# Patient Record
Sex: Female | Born: 1980 | Race: White | Hispanic: No | Marital: Single | Smoking: Current every day smoker
Health system: Southern US, Community
[De-identification: ages and names within clinical notes are randomized; demographics above are authoritative.]

## PROBLEM LIST (undated history)

## (undated) ENCOUNTER — Inpatient Hospital Stay (HOSPITAL_COMMUNITY): Payer: Self-pay

## (undated) DIAGNOSIS — F419 Anxiety disorder, unspecified: Secondary | ICD-10-CM

## (undated) DIAGNOSIS — F341 Dysthymic disorder: Secondary | ICD-10-CM

## (undated) DIAGNOSIS — Z872 Personal history of diseases of the skin and subcutaneous tissue: Secondary | ICD-10-CM

## (undated) DIAGNOSIS — G43909 Migraine, unspecified, not intractable, without status migrainosus: Secondary | ICD-10-CM

## (undated) DIAGNOSIS — O24419 Gestational diabetes mellitus in pregnancy, unspecified control: Secondary | ICD-10-CM

## (undated) HISTORY — PX: OVARIAN CYST SURGERY: SHX726

---

## 2002-05-25 ENCOUNTER — Encounter (INDEPENDENT_AMBULATORY_CARE_PROVIDER_SITE_OTHER): Payer: Self-pay

## 2002-05-25 ENCOUNTER — Observation Stay (HOSPITAL_COMMUNITY): Admission: AD | Admit: 2002-05-25 | Discharge: 2002-05-26 | Payer: Self-pay | Admitting: Gynecology

## 2002-05-25 ENCOUNTER — Encounter: Payer: Self-pay | Admitting: Gynecology

## 2002-11-18 ENCOUNTER — Other Ambulatory Visit: Admission: RE | Admit: 2002-11-18 | Discharge: 2002-11-18 | Payer: Self-pay | Admitting: Gynecology

## 2003-12-05 ENCOUNTER — Ambulatory Visit: Payer: Self-pay | Admitting: Internal Medicine

## 2003-12-20 ENCOUNTER — Ambulatory Visit: Payer: Self-pay | Admitting: Internal Medicine

## 2004-01-15 ENCOUNTER — Other Ambulatory Visit: Admission: RE | Admit: 2004-01-15 | Discharge: 2004-01-15 | Payer: Self-pay | Admitting: Gynecology

## 2004-01-31 ENCOUNTER — Ambulatory Visit: Payer: Self-pay | Admitting: Internal Medicine

## 2004-03-12 ENCOUNTER — Ambulatory Visit: Payer: Self-pay | Admitting: Internal Medicine

## 2004-04-01 ENCOUNTER — Ambulatory Visit: Payer: Self-pay | Admitting: Internal Medicine

## 2004-05-01 ENCOUNTER — Ambulatory Visit: Payer: Self-pay | Admitting: Internal Medicine

## 2004-05-10 ENCOUNTER — Ambulatory Visit: Payer: Self-pay | Admitting: Internal Medicine

## 2004-11-01 ENCOUNTER — Ambulatory Visit: Payer: Self-pay | Admitting: Internal Medicine

## 2005-01-24 ENCOUNTER — Other Ambulatory Visit: Admission: RE | Admit: 2005-01-24 | Discharge: 2005-01-24 | Payer: Self-pay | Admitting: Gynecology

## 2005-04-28 ENCOUNTER — Ambulatory Visit: Payer: Self-pay | Admitting: Internal Medicine

## 2005-07-01 ENCOUNTER — Ambulatory Visit: Payer: Self-pay | Admitting: Internal Medicine

## 2005-09-03 ENCOUNTER — Other Ambulatory Visit: Admission: RE | Admit: 2005-09-03 | Discharge: 2005-09-03 | Payer: Self-pay | Admitting: Gynecology

## 2006-01-13 HISTORY — PX: DILATION AND CURETTAGE OF UTERUS: SHX78

## 2006-02-19 ENCOUNTER — Inpatient Hospital Stay (HOSPITAL_COMMUNITY): Admission: AD | Admit: 2006-02-19 | Discharge: 2006-02-19 | Payer: Self-pay | Admitting: Obstetrics and Gynecology

## 2006-04-09 ENCOUNTER — Encounter: Admission: RE | Admit: 2006-04-09 | Discharge: 2006-04-09 | Payer: Self-pay | Admitting: Obstetrics and Gynecology

## 2006-05-18 ENCOUNTER — Inpatient Hospital Stay (HOSPITAL_COMMUNITY): Admission: AD | Admit: 2006-05-18 | Discharge: 2006-05-22 | Payer: Self-pay | Admitting: Obstetrics and Gynecology

## 2006-05-23 ENCOUNTER — Encounter: Admission: RE | Admit: 2006-05-23 | Discharge: 2006-06-22 | Payer: Self-pay | Admitting: Obstetrics and Gynecology

## 2006-06-23 ENCOUNTER — Encounter: Admission: RE | Admit: 2006-06-23 | Discharge: 2006-07-22 | Payer: Self-pay | Admitting: Obstetrics and Gynecology

## 2006-07-23 ENCOUNTER — Encounter: Admission: RE | Admit: 2006-07-23 | Discharge: 2006-08-13 | Payer: Self-pay | Admitting: Obstetrics and Gynecology

## 2006-09-07 ENCOUNTER — Ambulatory Visit: Payer: Self-pay | Admitting: Internal Medicine

## 2006-11-23 ENCOUNTER — Encounter: Payer: Self-pay | Admitting: *Deleted

## 2006-11-23 DIAGNOSIS — Z9889 Other specified postprocedural states: Secondary | ICD-10-CM | POA: Insufficient documentation

## 2006-11-23 DIAGNOSIS — N83209 Unspecified ovarian cyst, unspecified side: Secondary | ICD-10-CM

## 2006-11-23 DIAGNOSIS — F341 Dysthymic disorder: Secondary | ICD-10-CM

## 2006-11-23 DIAGNOSIS — Z872 Personal history of diseases of the skin and subcutaneous tissue: Secondary | ICD-10-CM | POA: Insufficient documentation

## 2006-11-23 DIAGNOSIS — G43909 Migraine, unspecified, not intractable, without status migrainosus: Secondary | ICD-10-CM

## 2006-11-23 HISTORY — DX: Dysthymic disorder: F34.1

## 2006-11-23 HISTORY — DX: Personal history of diseases of the skin and subcutaneous tissue: Z87.2

## 2006-11-23 HISTORY — DX: Migraine, unspecified, not intractable, without status migrainosus: G43.909

## 2007-08-09 ENCOUNTER — Other Ambulatory Visit: Admission: RE | Admit: 2007-08-09 | Discharge: 2007-08-09 | Payer: Self-pay | Admitting: Gynecology

## 2007-11-05 ENCOUNTER — Telehealth: Payer: Self-pay | Admitting: Internal Medicine

## 2007-11-09 ENCOUNTER — Ambulatory Visit: Payer: Self-pay | Admitting: Internal Medicine

## 2007-11-09 DIAGNOSIS — K644 Residual hemorrhoidal skin tags: Secondary | ICD-10-CM | POA: Insufficient documentation

## 2007-11-09 DIAGNOSIS — K625 Hemorrhage of anus and rectum: Secondary | ICD-10-CM

## 2008-02-16 ENCOUNTER — Ambulatory Visit: Payer: Self-pay | Admitting: Gynecology

## 2008-04-25 ENCOUNTER — Ambulatory Visit: Payer: Self-pay | Admitting: Gynecology

## 2008-05-16 ENCOUNTER — Ambulatory Visit: Payer: Self-pay | Admitting: Gynecology

## 2008-08-09 ENCOUNTER — Ambulatory Visit: Payer: Self-pay | Admitting: Gynecology

## 2008-08-29 ENCOUNTER — Ambulatory Visit: Payer: Self-pay | Admitting: Gynecology

## 2008-08-29 ENCOUNTER — Other Ambulatory Visit: Admission: RE | Admit: 2008-08-29 | Discharge: 2008-08-29 | Payer: Self-pay | Admitting: Gynecology

## 2008-08-29 ENCOUNTER — Encounter: Payer: Self-pay | Admitting: Gynecology

## 2008-10-10 ENCOUNTER — Ambulatory Visit: Payer: Self-pay | Admitting: Gynecology

## 2008-11-14 ENCOUNTER — Telehealth: Payer: Self-pay | Admitting: Internal Medicine

## 2009-01-19 ENCOUNTER — Ambulatory Visit: Payer: Self-pay | Admitting: Gynecology

## 2009-01-26 ENCOUNTER — Ambulatory Visit: Payer: Self-pay | Admitting: Gynecology

## 2009-02-12 ENCOUNTER — Ambulatory Visit: Payer: Self-pay | Admitting: Gynecology

## 2009-04-26 ENCOUNTER — Ambulatory Visit: Payer: Self-pay | Admitting: Gynecology

## 2009-05-21 ENCOUNTER — Ambulatory Visit: Payer: Self-pay | Admitting: Gynecology

## 2009-05-25 ENCOUNTER — Telehealth: Payer: Self-pay | Admitting: Internal Medicine

## 2010-02-12 NOTE — Progress Notes (Signed)
Summary: pt seen today   Phone Note From Other Clinic Call back at [phone]   Caller: Sherrilyn Rist from Dr Reynold Bowen office Call For: Leone Payor Reason for Call: Schedule Patient Appt Summary of Call: Dr Audie Box would like this patient seen today for hemorroid thrombose Initial call taken by: Tawni Levy,  May 25, 2009 9:38 AM  Follow-up for Phone Call        Patient  has been treated for 1 week and now having worsening pain.  Dr Audie Box says hemorrhoid is thrombosed.  I have advied them we would send her to [facility] and not be able to treat here.  They will refer the patient to [facility]. Follow-up by: Darcey Nora RN, CGRN,  May 25, 2009 9:52 AM

## 2010-05-28 NOTE — Op Note (Signed)
NAMEPEGGIE, HORNAK             ACCOUNT NO.:  [id number]   MEDICAL RECORD NO.:  [id number]          PATIENT TYPE:  INP   LOCATION:  9126                          FACILITY:  [facility]   PHYSICIAN:  Duke Salvia. Marcelle Overlie, M.D.DATE OF BIRTH:  [birth year]   DATE OF PROCEDURE:  DATE OF DISCHARGE:                               OPERATIVE REPORT   This patient had an approximately two hour second stage.  I was not  notified of any FHR abnormalities during the pushing phase; was called  for delivery.  She was crowning and promptly delivered as soon as she  was prepped.  Tight nuchal cord was noted; was not reducible, was  clamped and cut with easy delivery of the shoulders.  Apgars 4 and 7.  Initially took some breaths but then failed to ventilate well.  Some  minimal Ambu ventilation with O2 and stimulation with excellent fetal  heart rate from the beginning.  Apgars 4 and 7, ph 7.09.  Peds team did  arrive to monitor the baby early on.  Placenta was spontaneous and  intact. Cord blood was sent.  EBL 350.  Only a small primary first  degree vaginal laceration repaired with 3-0 Vicryl ped sutures.  Mother  and baby doing well at that point.      Richard M. Marcelle Overlie, M.D.  Electronically Signed     RMH/MEDQ  D:  05/19/2006  T:  05/20/2006  Job:  [id number]

## 2010-05-31 NOTE — H&P (Signed)
NAME:  Kim Carrillo, Kim Carrillo                         ACCOUNT NO.:  [id number]   MEDICAL RECORD NO.:  [id number]                   PATIENT TYPE:  MAT   LOCATION:  [facility]                                 FACILITY:  [facility]   PHYSICIAN:  Timothy P. Fontaine, M.D.           DATE OF BIRTH:  29-May-1980   DATE OF ADMISSION:  05/25/2002  DATE OF DISCHARGE:                                HISTORY & PHYSICAL   CHIEF COMPLAINT:  Abdominal pain.   HISTORY OF PRESENT ILLNESS:  A 30 year old G2, P0, TAB1 female who presents  with a one-day history of increasing abdominal pain.  She was evaluated at  [facility], was found to have a positive UPT and acute  abdominal changes, and was sent to [facility] for further evaluation.  Patient notes a therapeutic abortion approximately two and one-half months  ago.  She had what was thought to be a normal period 04/28/2002, has not  bled since, and now presents with increasing abdominal pain.  Evaluation  here reveals a beta HCG in the 40 range, stable hemoglobin at 11, having  been 11 at the urgent care on their evaluation, and an ultrasound showing no  intrauterine gestational sac, a large amount of complex fluid in the pelvis,  consistent with blood, some in Morison's pouch, complex cystic mass, right  ovary, question ovarian cyst rupture versus ruptured ectopic.  Patient is  admitted at this time for diagnostic laparoscopy and removal of an ectopic  pregnancy.   PAST MEDICAL HISTORY:  None.   PAST SURGICAL HISTORY:  TAB.   ALLERGIES:  None.   GYNECOLOGIC HISTORY:  Otherwise unremarkable.   REVIEW OF SYSTEMS:  Noncontributory.   SOCIAL HISTORY:  Noncontributory.  Condom birth control.   ADMISSION EXAMINATION:  VITAL SIGNS:  Stable.  HEENT:  Normal.  LUNGS:  Clear.  CARDIAC:  Regular rate without rubs, murmurs, or gallops.  ABDOMINAL EXAM:  Abdomen is tense, diffusely tender with rebound and  guarding.  No gross mass is palpated.  PELVIC:  Bimanual with 4+ cervical motion tenderness, precluding adequate  bimanual.   ASSESSMENT AND PLAN:  A 30 year old G2, P0, TAB1 female, low-positive beta  human chorionic gonadotropin, acute abdominal changes, ultrasound consistent  with blood in the pelvis, history of therapeutic abortion two and one-half  months ago, reportedly normal menses 04/28/2002, hemoglobin 11, normal white  count.  Discussed with patient and her boyfriend the situation and  differential.  The low beta does not fit a clear picture for the ectopic,  although certainly with the large amount of blood and the fluid, that is the  leading diagnosis.  Other possibilities to include a normal intrauterine  pregnancy with ruptured ovarian cyst as well as nongynecologic pathology  were also discussed with her.  She understands that we will initiate a  diagnostic laparoscopy and then be directed as to what we find at that time.  If ectopic pregnancy,  I discussed a linear salpingostomy attempts versus  salpingectomy, either partial or complete.  Given the large amount of blood,  I suspect that there is a rupture and that partial, if not complete,  salpingectomy will be warranted.  The patient understands this and gives me  permission for removal of her tube if I feel necessary.  If cornual  pregnancy, patient understands that we must proceed with an exploratory  laparotomy for excision as well as that we may need to convert this to an  exploratory laparotomy at any time if felt necessary.  If pelvis is clear,  then the issue is whether to proceed with a dilation and curettage was  discussed with her and her boyfriend, and they both agree that a dilation  and curettage would be indicated and that they understand and accept that it  might interrupt a normal early pregnancy.  The risks of surgery were  discussed, the expected intraoperative, postoperative courses, use of the  laparoscope, multiple abdominal ports, as well  as the possible exploratory  laparotomy.  The risks of bleeding, necessitating transfusion, and the risks  of transfusion were discussed with her.  The risks of infection, both  internal, requiring prolonged antibiotics, as well as incisional, requiring  opening and draining of incisions or drainage of abscess formations were all  discussed with her.  The risks of inadvertant injury to internal organs,  including bowel, bladder, ureters, vessels, and nerves, necessitating major  exploratory reparative surgeries and future reparative surgeries, including  ostomy formation, were all discussed, understood, and accepted.  Patient's  questions were answered to her satisfaction, and she was ready to proceed  with surgery.                                               Timothy P. Audie Box, M.D.    TPF/MEDQ  D:  05/25/2002  T:  05/25/2002  Job:  [id number]

## 2010-05-31 NOTE — Op Note (Signed)
NAME:  Kim Carrillo, Kim Carrillo                         ACCOUNT NO.:  [id number]   MEDICAL RECORD NO.:  [id number]                   PATIENT TYPE:  MAT   LOCATION:  [facility]                                 FACILITY:  [facility]   PHYSICIAN:  Timothy P. Fontaine, M.D.           DATE OF BIRTH:  [birth year]   DATE OF PROCEDURE:  05/25/2002  DATE OF DISCHARGE:                                 OPERATIVE REPORT   PREOPERATIVE DIAGNOSIS:  Rule out ectopic pregnancy.   POSTOPERATIVE DIAGNOSIS:  Ruptured right ovarian cyst.   PROCEDURE:  Diagnostic laparoscopy.  Biopsy and fulguration of right ovarian  cyst.  Evacuation of hemoperitoneum.  Suction D & C.   SURGEON:  Timothy P. Fontaine, M.D.   ANESTHETIC:  General endotracheal.   COMPLICATIONS:  None.   ESTIMATED BLOOD LOSS:  Approximately 500 mL hemoperitoneum.  Minimal  surgical blood loss.   SPECIMENS:  Blood clot with ovarian capsule biopsy, D & C.   FINDINGS:  Approximately 500 mL hemoperitoneum.  Large clot adherent to the  right ovary.  Evacuation of the clot subsequently showed a pigmented, raised  area and a disrupted capsule with active bleeding, consistent with a  ruptured cyst.  Pelvis otherwise noted to be normal with anterior cul-de-sac  normal, posterior cul-de-sac normal, uterus normal size, shape, and contour,  right and left fallopian tubes normal length, caliber, and fimbriated ends.  Left ovary grossly normal, free and mobile.  Right ovary as described above.  Upper abdominal exam with blood coating of the intestines and hepatic space,  grossly normal to visual inspection.   PROCEDURE:  The patient was taken to the operating room and underwent  general endotracheal anesthesia.  She was placed in the low dorsal lithotomy  position, received an abdominal perineal vaginal preparation with Betadine  solution, __________ indwelling Foley catheterization, U/A performed, and a  Hulka tenaculum placed on the cervix.  The patient was then  draped in the  usual fashion.  A vertical infraumbilical incision was made.  The Veress  needle placed.  Position verified by water, and approximately 2 liters of  carbon dioxide gas was achieved without difficulty.  The 10 mm laparoscopic  trocar was then placed within the abdominal cavity without difficulty, its  position verified visually.  The left center suprapubic 5 mm port was then  placed under direct visualization after transillumination of the vessels  without difficulty.  Examination of the pelvic organs was then carried out  with the findings noted above.  A right-of-center 5 mm laparoscopic port was  also placed under direct visualization after transillumination for the  vessels.  The blood was then evacuated from the pelvis, the ovary irrigated,  the clot removed and sent as specimen.  A raised, pigmented area was noted.  This was biopsied off and again sent as specimen.  The bleeding edge of the  cyst was then cauterized with bipolar cautery, stopping the bleeding.  The  pelvis was then irrigated copiously, all blood and irrigant removed after  hemostasis was visualized, and the suprapubic ports were then removed under  direct visualization, and the infraumbilical port after the gas was allowed  to escape was backed out under direct visualization, noting adequate  hemostasis and no evidence of hernia formation. A 0 Vicryl interrupted  subcutaneous fascial stitch was placed infraumbilically, and all skin  incisions were closed using Dermabond skin adhesive.  0.25% Marcaine was  infiltrated in all skin incisions.  The frozen section was then called back  to the operating room with findings of clot but not evidence of POC.  As per  my prior discussion with the patient under this scenario, we proceeded with  a suction D & C, and the cervix was visualized, the Hulka tenaculum removed,  a single-tooth tenaculum placed, and the cervix was gently and gradually  dilated with the #7  suction curette.  The suction curettage was performed  with scant return.  A gentle sharp curettage was performed, all of which was  sent to pathology.  The instruments were removed.  Adequate hemostasis  visualized.  The patient was then placed in the supine position, awakened  without difficulty, and taken to the recovery room in good condition, having  tolerated the procedure well.                                               Timothy P. Audie Box, M.D.    TPF/MEDQ  D:  05/25/2002  T:  05/26/2002  Job:  [id number]

## 2010-05-31 NOTE — Discharge Summary (Signed)
NAME:  Kim Carrillo, Kim Carrillo                         ACCOUNT NO.:  [id number]   MEDICAL RECORD NO.:  [id number]                   PATIENT TYPE:  INP   LOCATION:  9118                                 [facility]ACILITY:  [facility]   PHYSICIAN:  Timothy P. Fontaine, M.D.           DATE OF BIRTH:  1980/09/03   DATE OF ADMISSION:  05/25/2002  DATE OF DISCHARGE:  05/26/2002                                 DISCHARGE SUMMARY   DISCHARGE DIAGNOSES:  1. Pregnancy.  2. Acute abdomen.  3. Hemoperitoneum.  4. Ruptured right ovarian cyst.   PROCEDURE:  1. Diagnostic laparoscopy.  2. Right ovarian biopsy and fulguration.  3. Evacuation of hemoperitoneum.  4. Dilatation and curettage.   PATHOLOGY:  Pending.   HOSPITAL COURSE:  The patient presented as per history and physical and was  taken to the operating room for diagnostic laparoscopy.  Laparoscopic  findings showed a bleeding right ovarian ruptured cyst with hemoperitoneum.  No gross evidence of ectopic pregnancy.  Frozen section from pathology from  evacuated clot ovarian capsule showed clot, no evidence of ectopic  pregnancy.  Per preoperative discussion a D&C was then performed.  Pathology  is pending.  The patient has done well since surgery.  Her preoperative  hematocrit is 33.  Postoperative hematocrit is 26.  She is doing scant  vaginal bleeding.  Discharge examination shows her abdomen to be soft,  minimal tenderness.  Incisions intact.  The patient was discharged with  Lortab 5.0 number 15 for pain with instructions to follow up in the office  in one week.  I discussed with the patient and her boyfriend the issues and  the absolute need for follow-up.  As per preoperative counseling, I reviewed  with them that if no overt ectopic pregnancy was found she could have an  abnormal intrauterine pregnancy or a remnant from her abortion two months  ago or a normal intrauterine pregnancy.  They have decided to proceed with  D&C regardless of the  possibilities and that even if it was a normal  pregnancy they would not want to continue it given the circumstances.  I did  discuss with them at discharge that very early pregnancies may be missed on  D&C and that there still could be an implantation with progression of a  pregnancy and the absolute need for follow-up postoperatively was stressed  and she will be seen in the office in one week.  Will plan on tentatively  following up on quantitative hCGs to assure clearance pending pathology  results for identifiable pregnancy tissue.  The patient's blood type is O+.  The patient's questions were answered and she will follow up in the office  in one week.  Timothy P. Audie Box, M.D.    TPF/MEDQ  D:  05/26/2002  T:  05/26/2002  Job:  [id number]

## 2010-09-09 ENCOUNTER — Other Ambulatory Visit: Payer: Self-pay | Admitting: Gynecology

## 2010-10-14 ENCOUNTER — Emergency Department (HOSPITAL_COMMUNITY): Payer: BC Managed Care – PPO

## 2010-10-14 ENCOUNTER — Emergency Department (HOSPITAL_COMMUNITY)
Admission: EM | Admit: 2010-10-14 | Discharge: 2010-10-14 | Disposition: A | Discharge status: Home / Self Care | Payer: BC Managed Care – PPO | Attending: Emergency Medicine | Admitting: Emergency Medicine

## 2010-10-14 DIAGNOSIS — R630 Anorexia: Secondary | ICD-10-CM | POA: Insufficient documentation

## 2010-10-14 DIAGNOSIS — R002 Palpitations: Secondary | ICD-10-CM | POA: Insufficient documentation

## 2010-10-14 DIAGNOSIS — R079 Chest pain, unspecified: Secondary | ICD-10-CM | POA: Insufficient documentation

## 2010-10-14 DIAGNOSIS — F411 Generalized anxiety disorder: Secondary | ICD-10-CM | POA: Insufficient documentation

## 2011-09-10 ENCOUNTER — Other Ambulatory Visit: Payer: Self-pay | Admitting: Gynecology

## 2011-10-13 ENCOUNTER — Emergency Department (HOSPITAL_BASED_OUTPATIENT_CLINIC_OR_DEPARTMENT_OTHER): Payer: BC Managed Care – PPO

## 2011-10-13 ENCOUNTER — Emergency Department (HOSPITAL_BASED_OUTPATIENT_CLINIC_OR_DEPARTMENT_OTHER)
Admission: EM | Admit: 2011-10-13 | Discharge: 2011-10-13 | Disposition: A | Discharge status: Home / Self Care | Payer: BC Managed Care – PPO | Attending: Emergency Medicine | Admitting: Emergency Medicine

## 2011-10-13 ENCOUNTER — Encounter (HOSPITAL_BASED_OUTPATIENT_CLINIC_OR_DEPARTMENT_OTHER): Payer: Self-pay | Admitting: *Deleted

## 2011-10-13 DIAGNOSIS — F411 Generalized anxiety disorder: Secondary | ICD-10-CM | POA: Insufficient documentation

## 2011-10-13 DIAGNOSIS — T7492XA Unspecified child maltreatment, confirmed, initial encounter: Secondary | ICD-10-CM | POA: Insufficient documentation

## 2011-10-13 DIAGNOSIS — IMO0002 Reserved for concepts with insufficient information to code with codable children: Secondary | ICD-10-CM | POA: Insufficient documentation

## 2011-10-13 DIAGNOSIS — S0083XA Contusion of other part of head, initial encounter: Secondary | ICD-10-CM | POA: Insufficient documentation

## 2011-10-13 DIAGNOSIS — S0003XA Contusion of scalp, initial encounter: Secondary | ICD-10-CM | POA: Insufficient documentation

## 2011-10-13 DIAGNOSIS — T7491XA Unspecified adult maltreatment, confirmed, initial encounter: Secondary | ICD-10-CM | POA: Insufficient documentation

## 2011-10-13 DIAGNOSIS — S0093XA Contusion of unspecified part of head, initial encounter: Secondary | ICD-10-CM

## 2011-10-13 HISTORY — DX: Anxiety disorder, unspecified: F41.9

## 2011-10-13 HISTORY — DX: Migraine, unspecified, not intractable, without status migrainosus: G43.909

## 2011-10-13 MED ORDER — HYDROCODONE-ACETAMINOPHEN 5-325 MG PO TABS: 2.0000 | ORAL_TABLET | ORAL | Status: DC | PRN

## 2011-10-13 MED ORDER — TRAMADOL HCL 50 MG PO TABS: 50.0000 mg | ORAL_TABLET | Freq: Four times a day (QID) | ORAL | Status: DC | PRN
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
## 2011-10-13 NOTE — ED Provider Notes (Signed)
Medical screening examination/treatment/procedure(s) were performed by non-physician practitioner and as supervising physician I was immediately available for consultation/collaboration.   Abigail Teall, MD 10/13/11 1729 

## 2012-05-07 ENCOUNTER — Emergency Department (HOSPITAL_COMMUNITY): Payer: Medicaid Other

## 2012-05-07 ENCOUNTER — Encounter (HOSPITAL_COMMUNITY): Payer: Self-pay | Admitting: *Deleted

## 2012-05-07 ENCOUNTER — Emergency Department (HOSPITAL_COMMUNITY)
Admission: EM | Admit: 2012-05-07 | Discharge: 2012-05-07 | Disposition: A | Discharge status: Home / Self Care | Payer: Medicaid Other | Attending: Emergency Medicine | Admitting: Emergency Medicine

## 2012-05-07 DIAGNOSIS — K8020 Calculus of gallbladder without cholecystitis without obstruction: Secondary | ICD-10-CM | POA: Diagnosis present

## 2012-05-07 DIAGNOSIS — R059 Cough, unspecified: Secondary | ICD-10-CM | POA: Diagnosis present

## 2012-05-07 DIAGNOSIS — R197 Diarrhea, unspecified: Secondary | ICD-10-CM | POA: Insufficient documentation

## 2012-05-07 DIAGNOSIS — B9789 Other viral agents as the cause of diseases classified elsewhere: Secondary | ICD-10-CM | POA: Insufficient documentation

## 2012-05-07 DIAGNOSIS — R05 Cough: Secondary | ICD-10-CM | POA: Diagnosis present

## 2012-05-07 DIAGNOSIS — F341 Dysthymic disorder: Secondary | ICD-10-CM | POA: Insufficient documentation

## 2012-05-07 DIAGNOSIS — K529 Noninfective gastroenteritis and colitis, unspecified: Secondary | ICD-10-CM | POA: Diagnosis present

## 2012-05-07 DIAGNOSIS — Z872 Personal history of diseases of the skin and subcutaneous tissue: Secondary | ICD-10-CM | POA: Insufficient documentation

## 2012-05-07 DIAGNOSIS — B349 Viral infection, unspecified: Secondary | ICD-10-CM

## 2012-05-07 DIAGNOSIS — R1110 Vomiting, unspecified: Secondary | ICD-10-CM | POA: Insufficient documentation

## 2012-05-07 DIAGNOSIS — Z8679 Personal history of other diseases of the circulatory system: Secondary | ICD-10-CM | POA: Insufficient documentation

## 2012-05-07 DIAGNOSIS — Z3202 Encounter for pregnancy test, result negative: Secondary | ICD-10-CM | POA: Insufficient documentation

## 2012-05-07 DIAGNOSIS — R509 Fever, unspecified: Secondary | ICD-10-CM | POA: Insufficient documentation

## 2012-05-07 DIAGNOSIS — Z79899 Other long term (current) drug therapy: Secondary | ICD-10-CM | POA: Insufficient documentation

## 2012-05-07 HISTORY — DX: Gestational diabetes mellitus in pregnancy, unspecified control: O24.419

## 2012-05-07 HISTORY — DX: Personal history of diseases of the skin and subcutaneous tissue: Z87.2

## 2012-05-07 HISTORY — DX: Migraine, unspecified, not intractable, without status migrainosus: G43.909

## 2012-05-07 HISTORY — DX: Dysthymic disorder: F34.1

## 2012-05-07 LAB — CBC WITH DIFFERENTIAL/PLATELET
Basophils Relative: 0 % (ref 0–1)
Eosinophils Absolute: 0 10*3/uL (ref 0.0–0.7)
Eosinophils Relative: 0 % (ref 0–5)
Hemoglobin: 12.5 g/dL (ref 12.0–15.0)
Lymphs Abs: 1.3 10*3/uL (ref 0.7–4.0)
MCH: 32.6 pg (ref 26.0–34.0)
MCHC: 35.1 g/dL (ref 30.0–36.0)
MCV: 93 fL (ref 78.0–100.0)
Monocytes Relative: 15 % — ABNORMAL HIGH (ref 3–12)
Neutrophils Relative %: 74 % (ref 43–77)
Platelets: 245 10*3/uL (ref 150–400)
RBC: 3.83 MIL/uL — ABNORMAL LOW (ref 3.87–5.11)

## 2012-05-07 LAB — COMPREHENSIVE METABOLIC PANEL
ALT: 41 U/L — ABNORMAL HIGH (ref 0–35)
AST: 46 U/L — ABNORMAL HIGH (ref 0–37)
Alkaline Phosphatase: 97 U/L (ref 39–117)
CO2: 28 mEq/L (ref 19–32)
Calcium: 8.6 mg/dL (ref 8.4–10.5)
GFR calc Af Amer: >90 mL/min (ref >90)
GFR calc non Af Amer: >90 mL/min (ref >90)
Glucose, Bld: 132 mg/dL — ABNORMAL HIGH (ref 70–99)
Potassium: 3.6 mEq/L (ref 3.5–5.1)
Sodium: 134 mEq/L — ABNORMAL LOW (ref 135–145)

## 2012-05-07 LAB — URINALYSIS, ROUTINE W REFLEX MICROSCOPIC
Bilirubin Urine: NEGATIVE
Glucose, UA: 250 mg/dL — AB
Ketones, ur: NEGATIVE mg/dL
Nitrite: NEGATIVE
Protein, ur: NEGATIVE mg/dL
Specific Gravity, Urine: 1.01 (ref 1.005–1.030)
Urobilinogen, UA: 0.2 mg/dL (ref 0.0–1.0)
pH: 7 (ref 5.0–8.0)

## 2012-05-07 LAB — POCT PREGNANCY, URINE: Preg Test, Ur: NEGATIVE

## 2012-05-07 MED ORDER — NAPROXEN 500 MG PO TABS: 500.0000 mg | ORAL_TABLET | Freq: Two times a day (BID) | ORAL | Status: DC

## 2012-05-07 MED ORDER — SODIUM CHLORIDE 0.9 % IV SOLN
1000.0000 mL | Freq: Once | INTRAVENOUS | Status: AC
  Administered 2012-05-07: 1000 mL via INTRAVENOUS

## 2012-05-07 MED ORDER — HYDROMORPHONE HCL PF 1 MG/ML IJ SOLN
0.5000 mg | INTRAMUSCULAR | Status: DC | PRN
  Administered 2012-05-07 (×2): 0.5 mg via INTRAVENOUS
  Filled 2012-05-07 (×2): qty 1

## 2012-05-07 MED ORDER — ACETAMINOPHEN 325 MG PO TABS
650.0000 mg | ORAL_TABLET | Freq: Once | ORAL | Status: AC
  Administered 2012-05-07: 650 mg via ORAL
  Filled 2012-05-07: qty 2

## 2012-05-07 MED ORDER — ONDANSETRON 8 MG PO TBDP: 8.0000 mg | ORAL_TABLET | Freq: Three times a day (TID) | ORAL | Status: DC | PRN

## 2012-05-07 MED ORDER — ONDANSETRON HCL 4 MG/2ML IJ SOLN
4.0000 mg | Freq: Once | INTRAMUSCULAR | Status: AC
  Administered 2012-05-07: 4 mg via INTRAVENOUS
  Filled 2012-05-07: qty 2

## 2012-05-07 MED ORDER — SODIUM CHLORIDE 0.9 % IV SOLN
1000.0000 mL | INTRAVENOUS | Status: DC
  Administered 2012-05-07: 1000 mL via INTRAVENOUS

## 2012-05-07 NOTE — ED Provider Notes (Signed)
History     CSN: [id number] Arrival date & time 05/07/12  1301 First MD Initiated Contact with Patient 05/07/12 1403      Chief Complaint  Patient presents with  . Fever  . Flank Pain    HPI Symptoms initially started on Sunday.  She had vomiting and diarrhea for a couple of days.  She stopped vomiting after Monday at 6pm however she has not felt completely well.  Her son was ill with a GI illness as well.  She was able to work the last two days but developed fevers last night.  She is having pain now in her abdomen.  More on the upper right.  Not sharp just painful.  Increases with movement.  Appetite has been OK but not great.  She has been able to keep down fluids and food today,.  She went to an urgent care and was sent to the ED. Past Medical History  Diagnosis Date  . Anxiety   . Migraine headache   . Complication of anesthesia     didn't fall asleep during sigmoidoscopy  . ANXIETY DEPRESSION 11/23/2006    Qualifier: Diagnosis of  By: Genelle Gather CMA, [place]    . ANAL FISSURE, HX OF 11/23/2006    Qualifier: Diagnosis of  By: Genelle Gather CMA, [place]    . MIGRAINE HEADACHE 11/23/2006    Qualifier: Diagnosis of  By: Genelle Gather CMA, [place]    . Gestational diabetes     Past Surgical History  Procedure Laterality Date  . Ovarian cyst surgery      No family history on file.  History  Substance Use Topics  . Smoking status: Never Smoker   . Smokeless tobacco: Never Used  . Alcohol Use: Yes     Comment: occasional    OB History   Grav Para Term Preterm Abortions TAB SAB Ect Mult Living                  Review of Systems  All other systems reviewed and are negative.    Allergies  Review of patient's allergies indicates no known allergies.  Home Medications   Current Outpatient Rx  Name  Route  Sig  Dispense  Refill  . ALPRAZolam (XANAX) 0.5 MG tablet   Oral   Take 0.5 mg by mouth daily.         Marland Kitchen ECHINACEA PO   Oral   Take 3 capsules by mouth daily.          . SUMAtriptan (IMITREX) 50 MG tablet   Oral   Take 50 mg by mouth once as needed for migraine.         . naproxen (NAPROSYN) 500 MG tablet   Oral   Take 1 tablet (500 mg total) by mouth 2 (two) times daily.   30 tablet   0   . ondansetron (ZOFRAN ODT) 8 MG disintegrating tablet   Oral   Take 1 tablet (8 mg total) by mouth every 8 (eight) hours as needed for nausea.   20 tablet   0     BP 120/79  Pulse 113  Temp(Src) 102.9 F (39.4 C) (Oral)  Resp 18  SpO2 100%  LMP 05/02/2012  Physical Exam  Nursing note and vitals reviewed. Constitutional: She appears well-developed and well-nourished. No distress.  HENT:  Head: Normocephalic and atraumatic.  Right Ear: External ear normal.  Left Ear: External ear normal.  Eyes: Conjunctivae are normal. Right eye exhibits no discharge. Left eye exhibits no  discharge. No scleral icterus.  Neck: Neck supple. No tracheal deviation present.  Cardiovascular: Normal rate, regular rhythm and intact distal pulses.   Pulmonary/Chest: Effort normal and breath sounds normal. No stridor. No respiratory distress. She has no wheezes. She has no rales.  Abdominal: Soft. Bowel sounds are normal. She exhibits no distension. There is tenderness in the right upper quadrant. There is no rigidity, no rebound and no guarding.  Musculoskeletal: She exhibits no edema and no tenderness.  Neurological: She is alert. She has normal strength. No sensory deficit. Cranial nerve deficit:  no gross defecits noted. She exhibits normal muscle tone. She displays no seizure activity. Coordination normal.  Skin: Skin is warm and dry. No rash noted.  Psychiatric: She has a normal mood and affect.    ED Course  Procedures (including critical care time) Medications  HYDROmorphone (DILAUDID) injection 0.5 mg (0.5 mg Intravenous Given 05/07/12 1702)  0.9 %  sodium chloride infusion (0 mLs Intravenous Stopped 05/07/12 1656)    Followed by  0.9 %  sodium chloride infusion  (0 mLs Intravenous Stopped 05/07/12 1815)    Followed by  0.9 %  sodium chloride infusion (1,000 mLs Intravenous New Bag/Given 05/07/12 1821)  ondansetron (ZOFRAN) injection 4 mg (4 mg Intravenous Given 05/07/12 1530)  acetaminophen (TYLENOL) tablet 650 mg (650 mg Oral Given 05/07/12 2112)  9:48 PM Pt improved but still with ttp ruq.  Labs Reviewed  URINALYSIS, ROUTINE W REFLEX MICROSCOPIC - Abnormal; Notable for the following:    APPearance CLOUDY (*)    Glucose, UA 250 (*)    Hgb urine dipstick TRACE (*)    Leukocytes, UA SMALL (*)    All other components within normal limits  URINE MICROSCOPIC-ADD ON - Abnormal; Notable for the following:    Squamous Epithelial / LPF FEW (*)    All other components within normal limits  COMPREHENSIVE METABOLIC PANEL - Abnormal; Notable for the following:    Sodium 134 (*)    Glucose, Bld 132 (*)    BUN 3 (*)    Albumin 3.1 (*)    AST 46 (*)    ALT 41 (*)    All other components within normal limits  CBC WITH DIFFERENTIAL - Abnormal; Notable for the following:    WBC 13.2 (*)    RBC 3.83 (*)    HCT 35.6 (*)    Neutro Abs 9.8 (*)    Lymphocytes Relative 10 (*)    Monocytes Relative 15 (*)    Monocytes Absolute 2.0 (*)    All other components within normal limits  LIPASE, BLOOD  POCT PREGNANCY, URINE   Dg Chest 2 View  05/07/2012  *RADIOLOGY REPORT*  Clinical Data: Fever, flank pain  CHEST - 2 VIEW  Comparison: 10/14/2010  Findings: Cardiomediastinal silhouette is within normal limits. The lungs are clear. No pleural effusion.  No pneumothorax.  No acute osseous abnormality.  IMPRESSION: Normal chest.   Original Report Authenticated By: Christiana Pellant, M.D.    US Abdomen Complete  05/07/2012  *RADIOLOGY REPORT*  Clinical Data:  32 year old female with abdominal pain, fever and elevated LFTs.  ABDOMINAL ULTRASOUND COMPLETE  Comparison:  None  Findings:  Gallbladder: Mobile gallstones are identified, the largest measuring 7 mm.  There is no  evidence of pericholecystic fluid, sonographic Murphy's sign or gallbladder wall thickening.  Common Bile Duct:  There is no evidence of intrahepatic or extrahepatic biliary dilation. The CBD measures 2.8 mm in greatest diameter.  Liver:  The liver is  within normal limits in parenchymal echogenicity. A 4 x 7 mm homogeneously hyperechoic lesion within the right liver likely represents a hemangioma.  IVC:  Appears normal.  Pancreas:  Although the pancreas is difficult to visualize in its entirety, no focal pancreatic abnormality is identified.  Spleen:  Within normal limits in size and echotexture.  Right kidney:  The right kidney is normal in size and parenchymal echogenicity.  There is no evidence of solid mass, hydronephrosis or definite renal calculi.  The right kidney measures 10.3 cm.  Left kidney:  The left kidney is normal in size and parenchymal echogenicity.  There is no evidence of solid mass, hydronephrosis or definite renal calculi.   The left kidney measures 11.0 cm.  Abdominal Aorta:  No abdominal aortic aneurysm identified.  There is no evidence of ascites.  IMPRESSION: Cholelithiasis without evidence of acute cholecystitis.  No other significant abnormalities identified.   Original Report Authenticated By: Harmon Pier, M.D.      1. Cholelithiasis   2. Viral illness       MDM  Gallstones noted on [place].  Symptoms concerning for cholecystitis  Pt was seen in the ED by Dr Michaell Cowing.  He felt that her gallstones were unrelated to her febrile illness.  [place] did not suggest a sonographic murphey's sign.  Pt conitnues to have fevers however there is no pneumonia on CXR, UTI not suggested by UA.  Possibly symptoms are viral.  Pt has been able to eat and drink without difficulty. Will dc home. Recc close follow up and recheck.       Celene Kras, MD 05/07/12 2149

## 2012-05-07 NOTE — ED Notes (Signed)
Pt states she had n/v/d on Sunday and Monday, started to feel better now developed fever and right sided flank pain. Was seen at urgent care today, possible bladder infection, was sent to ED for further eval

## 2012-05-07 NOTE — Consult Note (Signed)
Kim Carrillo  Jan 03, 1981 [id number]  CARE TEAM:  PCP: No primary provider on file.  Outpatient Care Team: Patient has no care team.  Inpatient Treatment Team: Treatment Team: Attending Provider: Celene Kras, MD; Registered Nurse: Pincus Sanes, RN; Technician: Valora Piccolo, EMT; Consulting Physician: Bishop Limbo, MD  This patient is a 32 y.o.female who presents today for surgical evaluation at the request of Dr. Linwood Dibbles, [facility] ED.   Reason for evaluation: Abdominal pain and gallstones  Pleasant young female.  Had nausea and vomiting that was rather severe at the same time as her son last weekend.  Was able to start keeping liquids down a few days ago.  Has been sticking to liquids and soups for fear of starting nausea again.  Tolerating that well.  Tolerating salads okay.  Appetite not back to normal yet.  Initially had some diarrhea about bowels better formed now.   No bloating or nausea with drinking or eating.  No heartburn or reflux.   However, developed fever to around 101 last night.  She was concerned.  Went to urgent care.  Her son has been sick through the week as well.  Sent to the emergency room.  Ultrasound showed gallstones.  Soreness on the right side.  I am asked to evaluate the patient.  No personal nor family history of GI/colon cancer, inflammatory bowel disease, irritable bowel syndrome, allergy such as Celiac Sprue, dietary/dairy problems, colitis, ulcers nor gastritis.  No blood in stool or emesis.  No history of hepatitis or pancreatitis.  She rarely drinks EtOH.  Normally she can think she wants.  She tends to avoid fast food.  She does not smoke.   No travel outside the country.  No changes in diet.  Not lightheaded or dizzy.  Urinating fine.  Not feeling dehydrated.  Desperate to try and drink some fluids.  She is very thirsty/even hungry.  Receive some IV fluids.  Pain is down.  Not had Dilaudid for a while.  She has been struggling with a cough.  Not very  productive.  Pain is mainly in the right back and flank.  Near the rib cage.  Little bit on the right upper quadrant as well.  Pains worsens with cough.  Not with eating.    Past Medical History  Diagnosis Date  . Anxiety   . Migraine headache   . Complication of anesthesia     didn't fall asleep during sigmoidoscopy  . ANXIETY DEPRESSION 11/23/2006    Qualifier: Diagnosis of  By: Genelle Gather CMA, [place]    . ANAL FISSURE, HX OF 11/23/2006    Qualifier: Diagnosis of  By: Genelle Gather CMA, [place]    . MIGRAINE HEADACHE 11/23/2006    Qualifier: Diagnosis of  By: Genelle Gather CMA, [place]      Past Surgical History  Procedure Laterality Date  . Ovarian cyst surgery    . Sigmoidscopy      History   Social History  . Marital Status: Single    Spouse Name: N/A    Number of Children: N/A  . Years of Education: N/A   Occupational History  . Not on file.   Social History Main Topics  . Smoking status: Never Smoker   . Smokeless tobacco: Never Used  . Alcohol Use: Yes     Comment: occasional  . Drug Use: No  . Sexually Active: Not on file   Other Topics Concern  . Not on file   Social History Narrative  .  No narrative on file    No family history on file.  Current Facility-Administered Medications  Medication Dose Route Frequency Provider Last Rate Last Dose  . 0.9 %  sodium chloride infusion  1,000 mL Intravenous Continuous Celene Kras, MD 125 mL/hr at 05/07/12 1821 1,000 mL at 05/07/12 1821  . HYDROmorphone (DILAUDID) injection 0.5 mg  0.5 mg Intravenous Q30 min PRN Celene Kras, MD   0.5 mg at 05/07/12 1702   Current Outpatient Prescriptions  Medication Sig Dispense Refill  . ALPRAZolam (XANAX) 0.5 MG tablet Take 0.5 mg by mouth daily.      Marland Kitchen ECHINACEA PO Take 3 capsules by mouth daily.      . SUMAtriptan (IMITREX) 50 MG tablet Take 50 mg by mouth once as needed for migraine.         No Known Allergies  ROS: Constitutional:  No fevers, chills, sweats.  Weight stable Eyes:   No vision changes, No discharge HENT:  No sore throats, nasal drainage Lymph: No neck swelling, No bruising easily Pulmonary:  No cough, productive sputum CV: No orthopnea, PND  Patient walks 60 minutes for about 2 miles without difficulty.  No exertional chest/neck/shoulder/arm pain. GI:  No personal nor family history of GI/colon cancer, inflammatory bowel disease, irritable bowel syndrome, allergy such as Celiac Sprue, dietary/dairy problems, colitis, ulcers nor gastritis.  No recent sick contacts/gastroenteritis.  No travel outside the country.  No changes in diet. Renal: No UTIs, No hematuria Genital:  No drainage, bleeding, masses Musculoskeletal: No severe joint pain.  Good ROM major joints Skin:  No sores or lesions.  No rashes Heme/Lymph:  No easy bleeding.  No swollen lymph nodes Neuro: No focal weakness/numbness.  No seizures Psych: No suicidal ideation.  No hallucinations  BP 120/79  Pulse 113  Temp(Src) 98.8 F (37.1 C) (Oral)  Resp 18  SpO2 100%  LMP 05/02/2012  Physical Exam: General: Pt awake/alert/oriented x4 in no major acute distress but tired.  Not toxic Eyes: PERRL, normal EOM. Sclera nonicteric Neuro: CN II-XII intact w/o focal sensory/motor deficits. Lymph: No head/neck/groin lymphadenopathy Psych:  No delerium/psychosis/paranoia HENT: Normocephalic, Mucus membranes moist.  No thrush Neck: Supple, No tracheal deviation Chest: Fair respiratory excursion.  Starts to cough profusely with deep breaths.  Pleuritic-type chest pain on right side.  Soreness along the right subcostal ridge and posterior flank. CV:  Pulses intact.  Regular rhythm Abdomen: Soft, Nondistended.  Mild soreness on right flank near anterior axillary line.  Not classic location.  No Murphy sign.  Otherwise rest of the abdomen nontender.  No incarcerated hernias. Ext:  SCDs BLE.  No significant edema.  No cyanosis Skin: No petechiae / purpurea.  No major sores Musculoskeletal: No severe joint  pain.  Good ROM major joints   Results:   Labs: Results for orders placed during the hospital encounter of 05/07/12 (from the past 48 hour(s))  URINALYSIS, ROUTINE W REFLEX MICROSCOPIC     Status: Abnormal   Collection Time    05/07/12  1:29 PM      Result Value Range   Color, Urine YELLOW  YELLOW   APPearance CLOUDY (*) CLEAR   Specific Gravity, Urine 1.010  1.005 - 1.030   pH 7.0  5.0 - 8.0   Glucose, UA 250 (*) NEGATIVE mg/dL   Hgb urine dipstick TRACE (*) NEGATIVE   Bilirubin Urine NEGATIVE  NEGATIVE   Ketones, ur NEGATIVE  NEGATIVE mg/dL   Protein, ur NEGATIVE  NEGATIVE mg/dL  Urobilinogen, UA 0.2  0.0 - 1.0 mg/dL   Nitrite NEGATIVE  NEGATIVE   Leukocytes, UA SMALL (*) NEGATIVE  URINE MICROSCOPIC-ADD ON     Status: Abnormal   Collection Time    05/07/12  1:29 PM      Result Value Range   Squamous Epithelial / LPF FEW (*) RARE   WBC, UA 3-6  <3 WBC/hpf   Bacteria, UA RARE  RARE  POCT PREGNANCY, URINE     Status: None   Collection Time    05/07/12  1:34 PM      Result Value Range   Preg Test, Ur NEGATIVE  NEGATIVE   Comment:            THE SENSITIVITY OF THIS     METHODOLOGY IS >24 mIU/mL  COMPREHENSIVE METABOLIC PANEL     Status: Abnormal   Collection Time    05/07/12  3:30 PM      Result Value Range   Sodium 134 (*) 135 - 145 mEq/L   Potassium 3.6  3.5 - 5.1 mEq/L   Chloride 98  96 - 112 mEq/L   CO2 28  19 - 32 mEq/L   Glucose, Bld 132 (*) 70 - 99 mg/dL   BUN 3 (*) 6 - 23 mg/dL   Creatinine, Ser 1.61  0.50 - 1.10 mg/dL   Calcium 8.6  8.4 - 09.6 mg/dL   Total Protein 6.3  6.0 - 8.3 g/dL   Albumin 3.1 (*) 3.5 - 5.2 g/dL   AST 46 (*) 0 - 37 U/L   ALT 41 (*) 0 - 35 U/L   Alkaline Phosphatase 97  39 - 117 U/L   Total Bilirubin 0.5  0.3 - 1.2 mg/dL   GFR calc non Af Amer >90  >90 mL/min   GFR calc Af Amer >90  >90 mL/min   Comment:            The eGFR has been calculated     using the CKD EPI equation.     This calculation has not been     validated in  all clinical     situations.     eGFR's persistently     <90 mL/min signify     possible Chronic Kidney Disease.  LIPASE, BLOOD     Status: None   Collection Time    05/07/12  3:30 PM      Result Value Range   Lipase 39  11 - 59 U/L  CBC WITH DIFFERENTIAL     Status: Abnormal   Collection Time    05/07/12  3:30 PM      Result Value Range   WBC 13.2 (*) 4.0 - 10.5 K/uL   RBC 3.83 (*) 3.87 - 5.11 MIL/uL   Hemoglobin 12.5  12.0 - 15.0 g/dL   HCT 04.5 (*) 40.9 - 81.1 %   MCV 93.0  78.0 - 100.0 fL   MCH 32.6  26.0 - 34.0 pg   MCHC 35.1  30.0 - 36.0 g/dL   RDW 91.4  78.2 - 95.6 %   Platelets 245  150 - 400 K/uL   Neutrophils Relative 74  43 - 77 %   Neutro Abs 9.8 (*) 1.7 - 7.7 K/uL   Lymphocytes Relative 10 (*) 12 - 46 %   Lymphs Abs 1.3  0.7 - 4.0 K/uL   Monocytes Relative 15 (*) 3 - 12 %   Monocytes Absolute 2.0 (*) 0.1 - 1.0 K/uL   Eosinophils  Relative 0  0 - 5 %   Eosinophils Absolute 0.0  0.0 - 0.7 K/uL   Basophils Relative 0  0 - 1 %   Basophils Absolute 0.0  0.0 - 0.1 K/uL    Imaging / Studies: US Abdomen Complete  05/07/2012  *RADIOLOGY REPORT*  Clinical Data:  32 year old female with abdominal pain, fever and elevated LFTs.  ABDOMINAL ULTRASOUND COMPLETE  Comparison:  None  Findings:  Gallbladder: Mobile gallstones are identified, the largest measuring 7 mm.  There is no evidence of pericholecystic fluid, sonographic Murphy's sign or gallbladder wall thickening.  Common Bile Duct:  There is no evidence of intrahepatic or extrahepatic biliary dilation. The CBD measures 2.8 mm in greatest diameter.  Liver:  The liver is within normal limits in parenchymal echogenicity. A 4 x 7 mm homogeneously hyperechoic lesion within the right liver likely represents a hemangioma.  IVC:  Appears normal.  Pancreas:  Although the pancreas is difficult to visualize in its entirety, no focal pancreatic abnormality is identified.  Spleen:  Within normal limits in size and echotexture.  Right  kidney:  The right kidney is normal in size and parenchymal echogenicity.  There is no evidence of solid mass, hydronephrosis or definite renal calculi.  The right kidney measures 10.3 cm.  Left kidney:  The left kidney is normal in size and parenchymal echogenicity.  There is no evidence of solid mass, hydronephrosis or definite renal calculi.   The left kidney measures 11.0 cm.  Abdominal Aorta:  No abdominal aortic aneurysm identified.  There is no evidence of ascites.  IMPRESSION: Cholelithiasis without evidence of acute cholecystitis.  No other significant abnormalities identified.   Original Report Authenticated By: Harmon Pier, M.D.     Medications / Allergies: per chart  Antibiotics: Anti-infectives   None      Assessment  Wyona Almas  32 y.o. female       Problem List:  Active Problems:   Gallstones   Gastroenteritis   Cough   FUO - UA negative  Right back and flank pain more suspicious for pneumonia or musculoskeletal strain given chronic cough and pleuritic/musculoskeletal pain profile.  Gallstones more likely to be incidental.  Not convinced cholecystitis is the diagnosis.  No gallbladder wall thickening or pericholecystic fluid.  No Murphy's sign.  Pain seems more a lateral caudal and posterior than expected.  Plan:  -CXR r/o pneumonia.  ?Aspiration pneumonitis from prior emesis?  Tx ABx if infiltrate seen  -PO trial - if tolerates, then cholecystitis unlikely.  Adv diet gradually  Agree with IV fluid resuscitation.  Agree with aggressive nausea control.  Consider nonsteroidals and heat for musculoskeletal type pain.  -Discussed with Dr. Lynelle Doctor.  If patient NPO and no diagnosis of pneumonia, presumed persisting gastroenteritis.  Aggressive fluid and nausea control.  If worsens, return to ER for reconsideration/support.  -VTE prophylaxis- SCDs, etc  -mobilize as tolerated to help recovery    Ardeth Sportsman, M.D., F.A.C.S. Gastrointestinal and  Minimally Invasive Surgery [facility] [address], [address] [place], [place] [address] [phone] Main / Paging   05/07/2012

## 2014-09-04 IMAGING — US US ABDOMEN COMPLETE
1 series · 13 of 25 positions shown · non-contrast
Comparison: None

CLINICAL DATA: 31-year-old female with abdominal pain, fever and
elevated LFTs.

ABDOMINAL ULTRASOUND COMPLETE

[Series 1: us abdomen complete · 0.30mm/px · 13 of 84 slices shown]
[im 1/84]
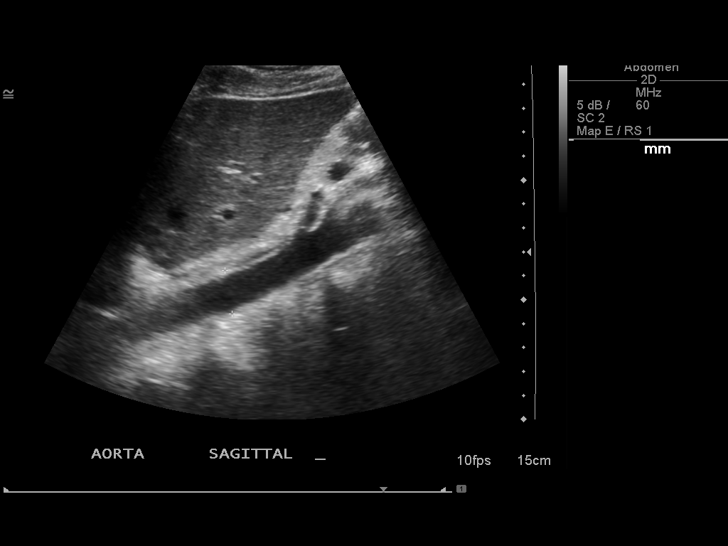
[im 7/84]
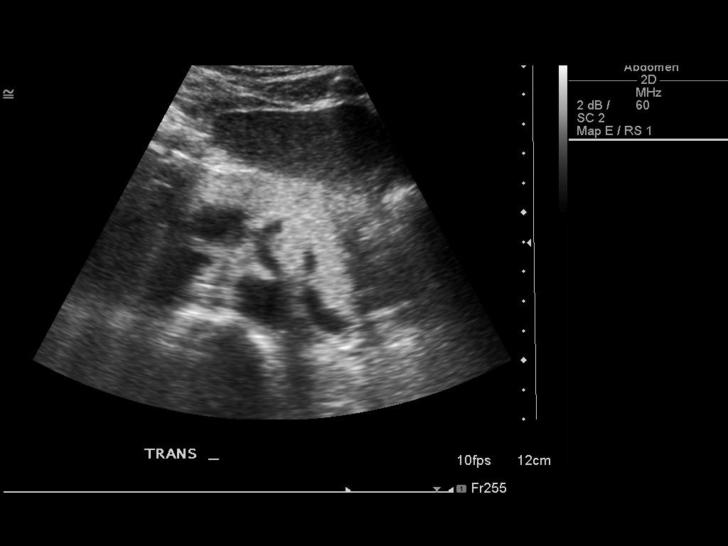
[im 14/84]
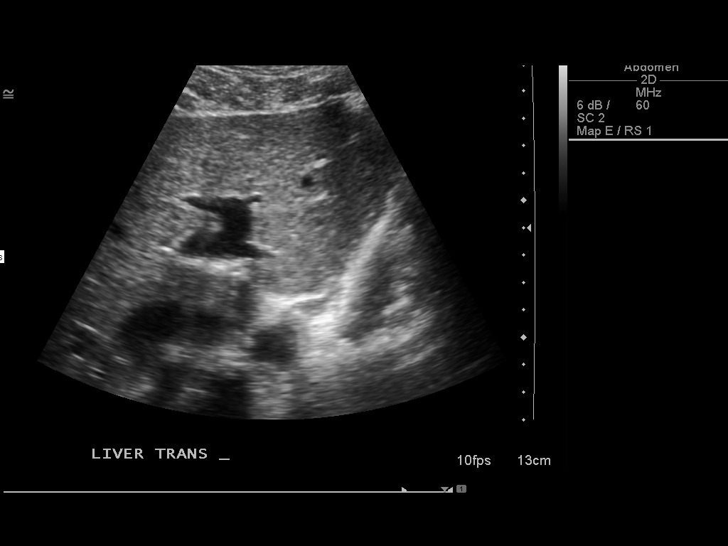
[im 21/84]
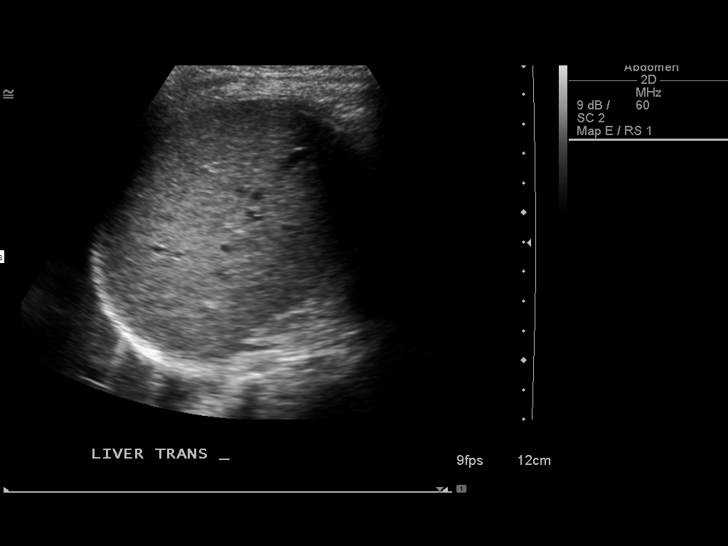
[im 28/84]
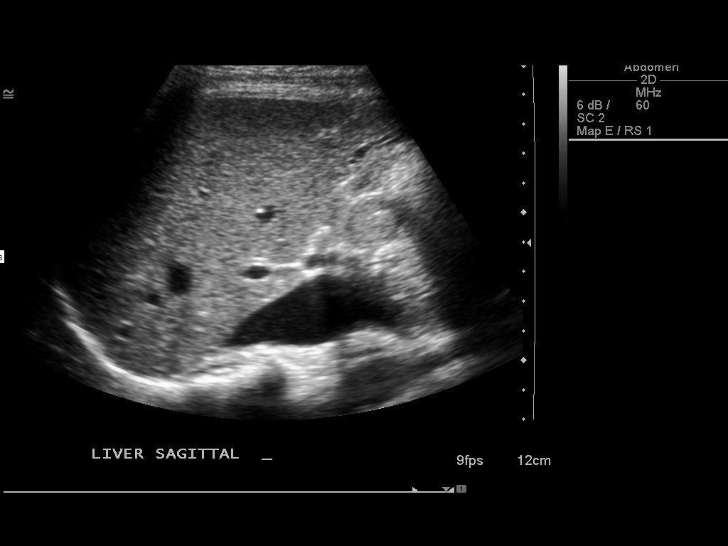
[im 35/84]
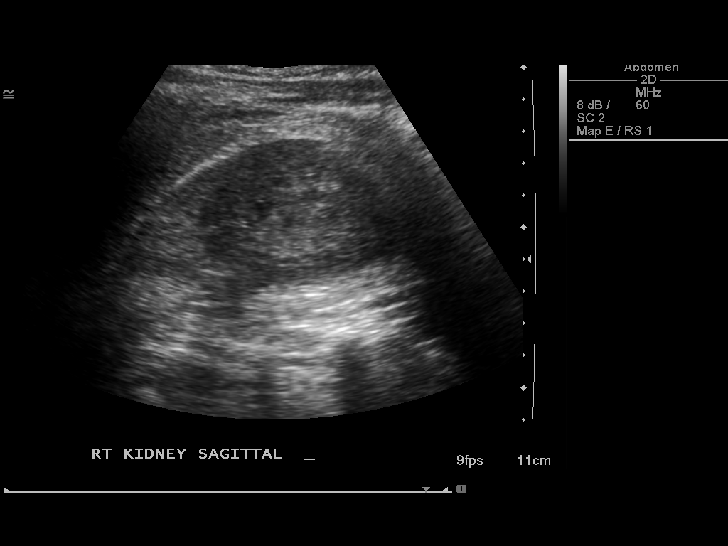
[im 42/84]
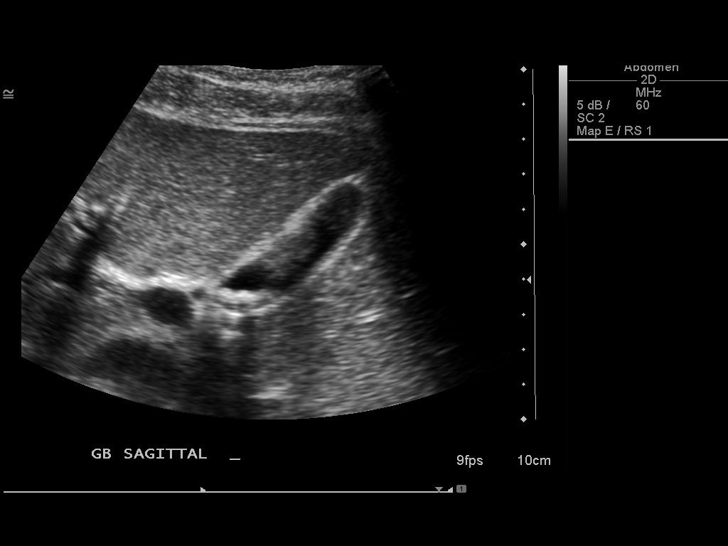
[im 49/84]
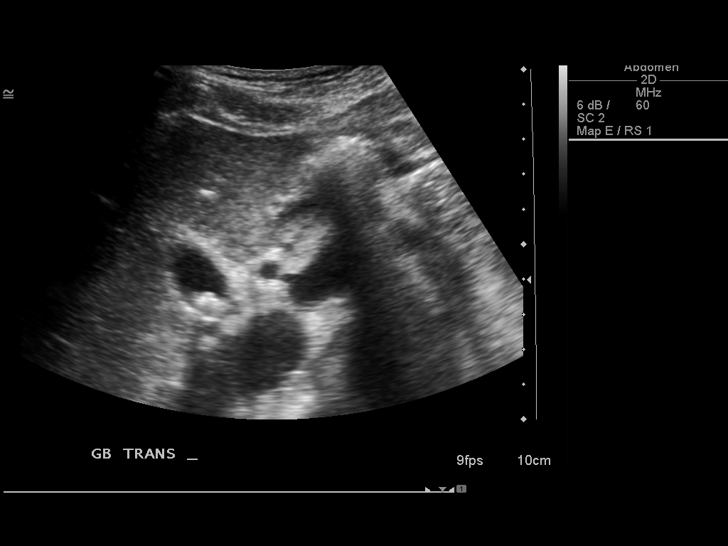
[im 56/84]
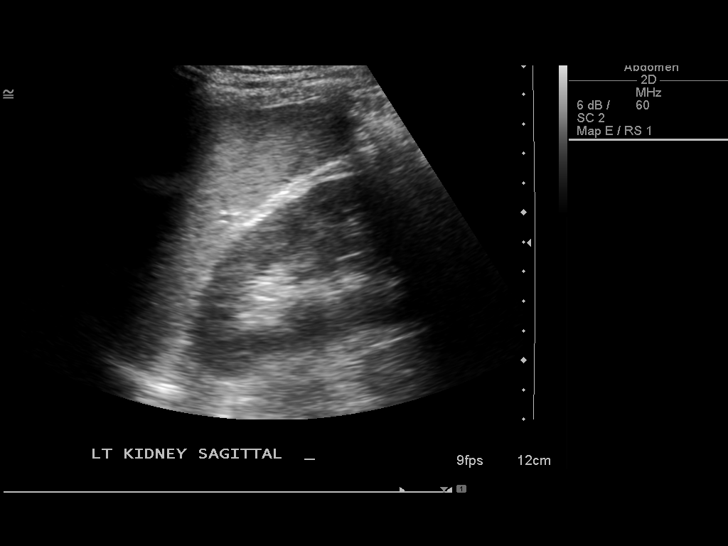
[im 63/84]
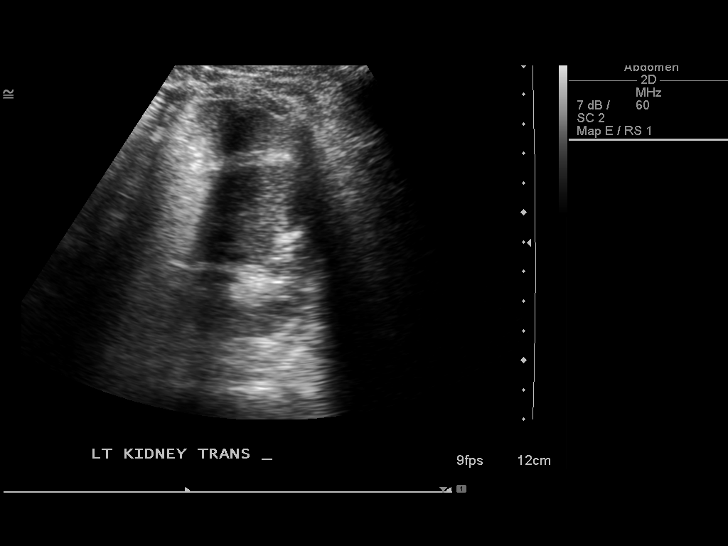
[im 70/84]
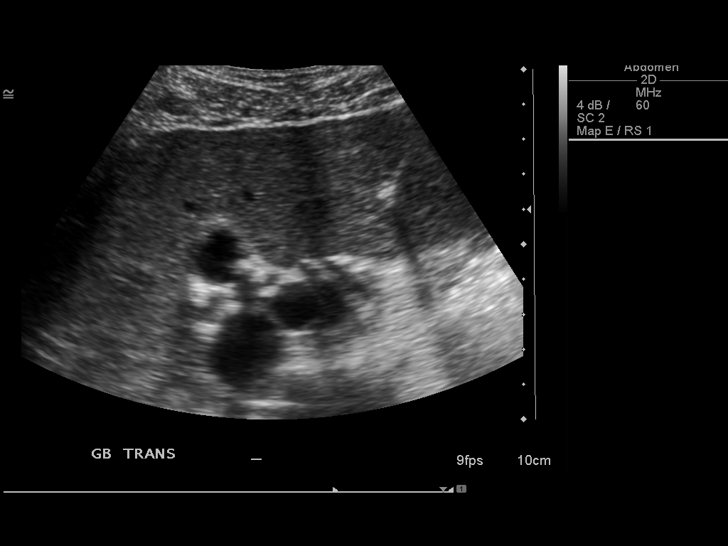
[im 77/84]
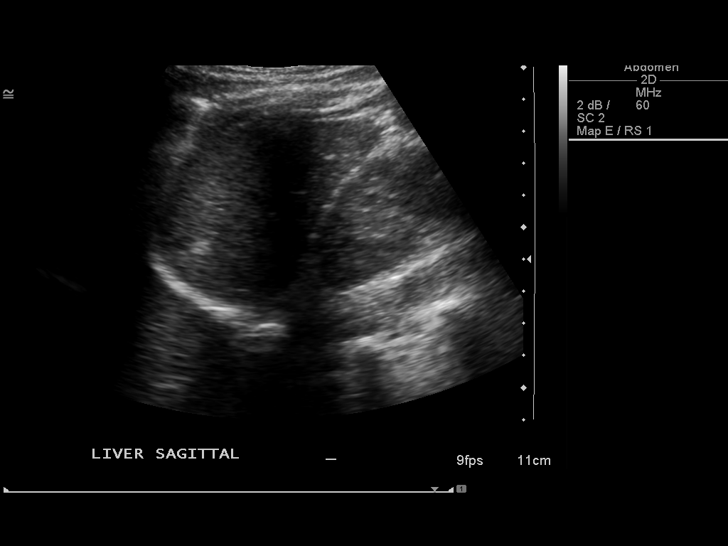
[im 84/84]
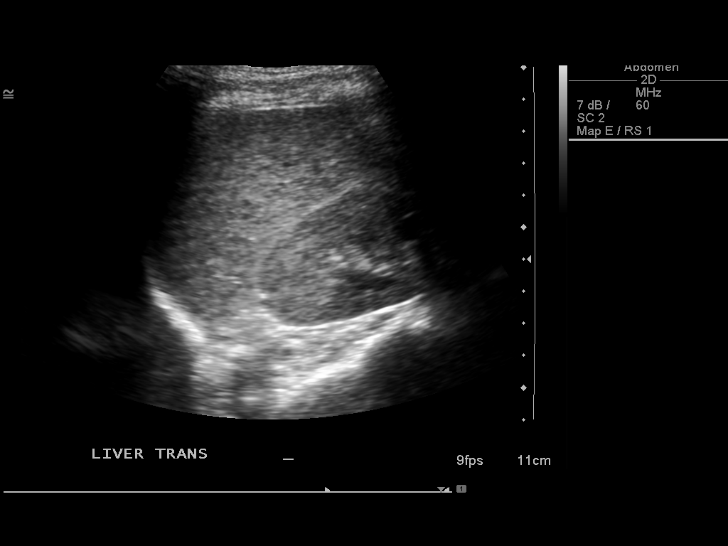

[13 of 25 positions shown; findings below may reference images not displayed]

FINDINGS: Gallbladder: Mobile gallstones are identified, the largest
measuring 7 mm.  There is no evidence of pericholecystic fluid,
sonographic Murphy's sign or gallbladder wall thickening.

Common Bile Duct:  There is no evidence of intrahepatic or
extrahepatic biliary dilation. The CBD measures 2.8 mm in greatest
diameter.

Liver:  The liver is within normal limits in parenchymal
echogenicity. A 4 x 7 mm homogeneously hyperechoic lesion within
the right liver likely represents a hemangioma.

IVC:  Appears normal.

Pancreas:  Although the pancreas is difficult to visualize in its
entirety, no focal pancreatic abnormality is identified.

Spleen:  Within normal limits in size and echotexture.

Right kidney:  The right kidney is normal in size and parenchymal
echogenicity.  There is no evidence of solid mass, hydronephrosis
or definite renal calculi.  The right kidney measures 10.3 cm.

Left kidney:  The left kidney is normal in size and parenchymal
echogenicity.  There is no evidence of solid mass, hydronephrosis
or definite renal calculi.   The left kidney measures 11.0 cm.

Abdominal Aorta:  No abdominal aortic aneurysm identified.

There is no evidence of ascites.
IMPRESSION: Cholelithiasis without evidence of acute cholecystitis.

No other significant abnormalities identified.

## 2014-09-21 ENCOUNTER — Other Ambulatory Visit: Payer: Self-pay

## 2014-09-22 ENCOUNTER — Other Ambulatory Visit: Payer: Self-pay | Admitting: Obstetrics & Gynecology

## 2014-09-22 DIAGNOSIS — O3680X0 Pregnancy with inconclusive fetal viability, not applicable or unspecified: Secondary | ICD-10-CM

## 2014-09-25 ENCOUNTER — Ambulatory Visit (INDEPENDENT_AMBULATORY_CARE_PROVIDER_SITE_OTHER): Payer: Medicaid Other

## 2014-09-25 DIAGNOSIS — O3680X0 Pregnancy with inconclusive fetal viability, not applicable or unspecified: Secondary | ICD-10-CM | POA: Diagnosis not present

## 2014-09-25 NOTE — Progress Notes (Signed)
[place] 10+6wks single IUP w/ys,pos fht 162,normal ov's bilat,crl 40.78mm,edd 04/17/2015

## 2014-10-03 ENCOUNTER — Ambulatory Visit (INDEPENDENT_AMBULATORY_CARE_PROVIDER_SITE_OTHER): Payer: Medicaid Other | Admitting: Advanced Practice Midwife

## 2014-10-03 ENCOUNTER — Encounter: Payer: Self-pay | Admitting: Advanced Practice Midwife

## 2014-10-03 VITALS — BP 120/78 | HR 96 | Wt 122.0 lb

## 2014-10-03 DIAGNOSIS — Z3682 Encounter for antenatal screening for nuchal translucency: Secondary | ICD-10-CM

## 2014-10-03 DIAGNOSIS — Z3491 Encounter for supervision of normal pregnancy, unspecified, first trimester: Secondary | ICD-10-CM | POA: Diagnosis not present

## 2014-10-03 DIAGNOSIS — Z3490 Encounter for supervision of normal pregnancy, unspecified, unspecified trimester: Secondary | ICD-10-CM | POA: Insufficient documentation

## 2014-10-03 DIAGNOSIS — Z369 Encounter for antenatal screening, unspecified: Secondary | ICD-10-CM

## 2014-10-03 DIAGNOSIS — Z331 Pregnant state, incidental: Secondary | ICD-10-CM

## 2014-10-03 DIAGNOSIS — Z1389 Encounter for screening for other disorder: Secondary | ICD-10-CM

## 2014-10-03 LAB — POCT URINALYSIS DIPSTICK
GLUCOSE UA: NEGATIVE
Ketones, UA: NEGATIVE
Leukocytes, UA: NEGATIVE
NITRITE UA: NEGATIVE
Protein, UA: NEGATIVE
RBC UA: NEGATIVE

## 2014-10-03 MED ORDER — DOXYLAMINE-PYRIDOXINE 10-10 MG PO TBEC: DELAYED_RELEASE_TABLET | ORAL | Status: DC

## 2014-10-03 MED ORDER — CITRANATAL B-CALM 20-1 MG & 2 X 25 MG PO MISC: 1.0000 | Freq: Three times a day (TID) | ORAL | Status: DC

## 2014-10-03 NOTE — Progress Notes (Signed)
  Subjective:    Kim Carrillo is a [id number] [redacted]w[redacted]d being seen today for her first obstetrical visit.  Her obstetrical history is significant for 2 early TAB and 1 uncomplicated SVD 2008.Marland Kitchen  Pregnancy history fully reviewed.  Patient reports nausea and fatigue. Some vomiting. Requests meds.    Filed Vitals:   10/03/14 1530  BP: 120/78  Pulse: 96  Weight: 122 lb (55.339 kg)    HISTORY: OB History  Gravida Para Term Preterm AB SAB TAB Ectopic Multiple Living  0 2 0 2 0 0 1    # Outcome Date GA Lbr Len/2nd Weight Sex Delivery Anes PTL Lv  4 Current           3 TAB 05/19/06          2 Term 05/19/06 [redacted]w[redacted]d  6 lb 9 oz (2.977 kg)  Vag-Spont   Y  1 TAB 03/2006             Past Medical History  Diagnosis Date  . Anxiety   . Migraine headache   . Complication of anesthesia     didn't fall asleep during sigmoidoscopy  . ANXIETY DEPRESSION 11/23/2006    Qualifier: Diagnosis of  By: Genelle Gather CMA, [place]    . ANAL FISSURE, HX OF 11/23/2006    Qualifier: Diagnosis of  By: Genelle Gather CMA, [place]    . MIGRAINE HEADACHE 11/23/2006    Qualifier: Diagnosis of  By: Genelle Gather CMA, [place]    . Gestational diabetes    Past Surgical History  Procedure Laterality Date  . Ovarian cyst surgery    . Dilation and curettage of uterus  2008    TAB X 2   Family History  Problem Relation Age of Onset  . Hypertension Mother      Exam                                      System:     Skin: normal coloration and turgor, no rashes    Neurologic: oriented, normal, normal mood   Extremities: normal strength, tone, and muscle mass   HEENT PERRLA   Mouth/Teeth mucous membranes moist, normal dentition   Neck supple and no masses   Cardiovascular: regular rate and rhythm   Respiratory:  appears well, vitals normal, no respiratory distress, acyanotic   Abdomen: soft, non-tender;  FHR: 160          Assessment:    Pregnancy: [id number] Patient Active Problem List   Diagnosis Date  Noted  . Supervision of normal pregnancy 10/03/2014  . Gallstones 05/07/2012  . Gastroenteritis 05/07/2012  . Cough 05/07/2012  . HEMORRHOIDS-EXTERNAL 11/09/2007  . RECTAL BLEEDING 11/09/2007  . ANXIETY DEPRESSION 11/23/2006  . MIGRAINE HEADACHE 11/23/2006  . OVARIAN CYST 11/23/2006  . ANAL FISSURE, HX OF 11/23/2006  . OVARIAN CYSTECTOMY, HX OF 11/23/2006        Plan:     Initial labs drawn. Continue prenatal vitamins  Problem list reviewed and updated  Reviewed n/v relief measures and warning s/s to report . Diclegis sample and rx w/prior auth done Reviewed recommended weight gain based on pre-gravid BMI  Encouraged well-balanced diet Genetic Screening discussed Integrated Screen: requested.  Ultrasound discussed; fetal survey: requested.  Follow up in 4 weeks for [facility] and ASAP for NT/IT only. Marland Kitchen  Kim Carrillo 10/03/2014

## 2014-10-03 NOTE — Progress Notes (Signed)
Pt states that she has some questions she wants to ask about.

## 2014-10-03 NOTE — Patient Instructions (Signed)

## 2014-10-04 LAB — GC/CHLAMYDIA PROBE AMP
CHLAMYDIA, DNA PROBE: NEGATIVE
Neisseria gonorrhoeae by PCR: NEGATIVE

## 2014-10-05 LAB — URINE CULTURE: Organism ID, Bacteria: NO GROWTH

## 2014-10-10 LAB — ANTIBODY SCREEN: ANTIBODY SCREEN: NEGATIVE

## 2014-10-10 LAB — HEPATITIS B SURFACE ANTIGEN: Hepatitis B Surface Ag: NEGATIVE

## 2014-10-10 LAB — PMP SCREEN PROFILE (10S), URINE
Amphetamine Screen, Ur: NEGATIVE ng/mL
BENZODIAZEPINE SCREEN, URINE: NEGATIVE ng/mL
Barbiturate Screen, Ur: NEGATIVE ng/mL
CANNABINOIDS UR QL SCN: NEGATIVE ng/mL
CREATININE(CRT), U: 26.1 mg/dL (ref 20.0–300.0)
Cocaine(Metab.)Screen, Urine: NEGATIVE ng/mL
Methadone Scn, Ur: NEGATIVE ng/mL
OXYCODONE+OXYMORPHONE UR QL SCN: NEGATIVE ng/mL
Opiate Scrn, Ur: NEGATIVE ng/mL
PCP Scrn, Ur: NEGATIVE ng/mL
Ph of Urine: 5.7 (ref 4.5–8.9)
Propoxyphene, Screen: NEGATIVE ng/mL

## 2014-10-10 LAB — URINALYSIS, ROUTINE W REFLEX MICROSCOPIC
BILIRUBIN UA: NEGATIVE
Glucose, UA: NEGATIVE
Ketones, UA: NEGATIVE
Leukocytes, UA: NEGATIVE
NITRITE UA: NEGATIVE
PH UA: 6.5 (ref 5.0–7.5)
Protein, UA: NEGATIVE
RBC UA: NEGATIVE
Specific Gravity, UA: 1.008 (ref 1.005–1.030)
UUROB: 0.2 mg/dL (ref 0.2–1.0)

## 2014-10-10 LAB — CBC
HEMATOCRIT: 36.5 % (ref 34.0–46.6)
Hemoglobin: 12.4 g/dL (ref 11.1–15.9)
MCH: 31.4 pg (ref 26.6–33.0)
MCHC: 34 g/dL (ref 31.5–35.7)
MCV: 92 fL (ref 79–97)
PLATELETS: 337 10*3/uL (ref 150–379)
RBC: 3.95 x10E6/uL (ref 3.77–5.28)
RDW: 12.1 % — AB (ref 12.3–15.4)
WBC: 8.6 10*3/uL (ref 3.4–10.8)

## 2014-10-10 LAB — RUBELLA SCREEN: RUBELLA: 1.15 {index} (ref >0.99)

## 2014-10-10 LAB — VARICELLA ZOSTER ANTIBODY, IGG: VARICELLA: 1275 {index} (ref >165)

## 2014-10-10 LAB — ABO/RH: Rh Factor: POSITIVE

## 2014-10-10 LAB — CYSTIC FIBROSIS MUTATION 97: GENE DIS ANAL CARRIER INTERP BLD/T-IMP: NOT DETECTED

## 2014-10-10 LAB — HIV ANTIBODY (ROUTINE TESTING W REFLEX): HIV Screen 4th Generation wRfx: NONREACTIVE

## 2014-10-10 LAB — RPR: RPR: NONREACTIVE

## 2014-10-12 ENCOUNTER — Ambulatory Visit (INDEPENDENT_AMBULATORY_CARE_PROVIDER_SITE_OTHER): Payer: Medicaid Other

## 2014-10-12 ENCOUNTER — Other Ambulatory Visit: Payer: Medicaid Other

## 2014-10-12 DIAGNOSIS — Z369 Encounter for antenatal screening, unspecified: Secondary | ICD-10-CM

## 2014-10-12 DIAGNOSIS — Z36 Encounter for antenatal screening of mother: Secondary | ICD-10-CM | POA: Diagnosis not present

## 2014-10-12 DIAGNOSIS — Z3682 Encounter for antenatal screening for nuchal translucency: Secondary | ICD-10-CM

## 2014-10-12 NOTE — Progress Notes (Signed)
[place] 13+2wks,single IUP pos fht 152 bpm,CRL 71.73mm,NT 1.46mm,NB present,normal ov's bilat,post pl

## 2014-10-14 LAB — MATERNAL SCREEN, INTEGRATED #1
Crown Rump Length: 71.9 mm
Gest. Age on Collection Date: 13 weeks
MATERNAL AGE AT EDD: 35.4 a
NUCHAL TRANSLUCENCY (NT): 1.8 mm
NUMBER OF FETUSES: 1
PAPP-A Value: 3445.1 ng/mL
Weight: 123 [lb_av]

## 2014-10-31 ENCOUNTER — Encounter: Payer: Self-pay | Admitting: Women's Health

## 2014-10-31 ENCOUNTER — Ambulatory Visit (INDEPENDENT_AMBULATORY_CARE_PROVIDER_SITE_OTHER): Payer: Medicaid Other | Admitting: Women's Health

## 2014-10-31 VITALS — BP 106/60 | HR 84 | Wt 128.0 lb

## 2014-10-31 DIAGNOSIS — Z3682 Encounter for antenatal screening for nuchal translucency: Secondary | ICD-10-CM

## 2014-10-31 DIAGNOSIS — Z1389 Encounter for screening for other disorder: Secondary | ICD-10-CM

## 2014-10-31 DIAGNOSIS — Z331 Pregnant state, incidental: Secondary | ICD-10-CM

## 2014-10-31 DIAGNOSIS — Z3492 Encounter for supervision of normal pregnancy, unspecified, second trimester: Secondary | ICD-10-CM

## 2014-10-31 DIAGNOSIS — Z363 Encounter for antenatal screening for malformations: Secondary | ICD-10-CM

## 2014-10-31 LAB — POCT URINALYSIS DIPSTICK
Blood, UA: NEGATIVE
GLUCOSE UA: NEGATIVE
Ketones, UA: NEGATIVE
LEUKOCYTES UA: NEGATIVE
NITRITE UA: NEGATIVE
Protein, UA: NEGATIVE

## 2014-10-31 NOTE — Patient Instructions (Signed)

## 2014-10-31 NOTE — Progress Notes (Signed)
Low-risk OB appointment [id number] 2642w0d Estimated Date of Delivery: 04/17/15 BP 106/60 mmHg  Pulse 84  Wt 128 lb (58.06 kg)  LMP  (LMP Unknown)  BP, weight, and urine reviewed.  Refer to obstetrical flow sheet for FH & FHR.  No fm yet. Denies cramping, lof, vb, or uti s/s. No complaints. Needs to have filling- dental release given.  Reviewed warning s/s to report. Plan:  Continue routine obstetrical care  F/U in 4wks for OB appointment and anatomy u/s 2nd IT today, declines flu shot

## 2014-11-06 LAB — MATERNAL SCREEN, INTEGRATED #2
AFP MARKER: 38.4 ng/mL
AFP MOM: 1.13
CROWN RUMP LENGTH: 71.9 mm
DIA MoM: 1.33
DIA Value: 257.5 pg/mL
Estriol, Unconjugated: 0.81 ng/mL
GESTATIONAL AGE: 16.1 wk
Gest. Age on Collection Date: 13 weeks
Maternal Age at EDD: 35.4 years
Nuchal Translucency (NT): 1.8 mm
Nuchal Translucency MoM: 1.02
Number of Fetuses: 1
PAPP-A MoM: 2.3
PAPP-A VALUE: 3445.1 ng/mL
TEST RESULTS: NEGATIVE
WEIGHT: 128 [lb_av]
Weight: 123 [lb_av]
hCG MoM: 1.32
hCG Value: 49.9 IU/mL
uE3 MoM: 0.89

## 2014-11-28 ENCOUNTER — Ambulatory Visit (INDEPENDENT_AMBULATORY_CARE_PROVIDER_SITE_OTHER): Payer: Medicaid Other

## 2014-11-28 ENCOUNTER — Encounter: Payer: Self-pay | Admitting: Women's Health

## 2014-11-28 ENCOUNTER — Ambulatory Visit (INDEPENDENT_AMBULATORY_CARE_PROVIDER_SITE_OTHER): Payer: Medicaid Other | Admitting: Women's Health

## 2014-11-28 VITALS — BP 104/58 | HR 84 | Wt 132.0 lb

## 2014-11-28 DIAGNOSIS — Z3A20 20 weeks gestation of pregnancy: Secondary | ICD-10-CM

## 2014-11-28 DIAGNOSIS — Z36 Encounter for antenatal screening of mother: Secondary | ICD-10-CM

## 2014-11-28 DIAGNOSIS — Z3492 Encounter for supervision of normal pregnancy, unspecified, second trimester: Secondary | ICD-10-CM

## 2014-11-28 DIAGNOSIS — Z331 Pregnant state, incidental: Secondary | ICD-10-CM

## 2014-11-28 DIAGNOSIS — Z1389 Encounter for screening for other disorder: Secondary | ICD-10-CM

## 2014-11-28 DIAGNOSIS — Z363 Encounter for antenatal screening for malformations: Secondary | ICD-10-CM

## 2014-11-28 LAB — POCT URINALYSIS DIPSTICK
GLUCOSE UA: NEGATIVE
Ketones, UA: NEGATIVE
Leukocytes, UA: NEGATIVE
NITRITE UA: NEGATIVE
PROTEIN UA: NEGATIVE
RBC UA: NEGATIVE

## 2014-11-28 NOTE — Progress Notes (Signed)
US 20wks,cx 4.2cm,trans head lt,normal ov's bilat,post pl,svp 5.6cm,fhr 144 bpm,measurement c/w dates,efw 337g,anatomy complete,no obvious abn seen

## 2014-11-28 NOTE — Patient Instructions (Addendum)
Maternity compression stockings (that go over your belly)- google to find online  Dr. Bern[facility] Kim Carrillo [phone]  Second Trimester of Pregnancy The second trimester is from week 13 through week 28, months 4 through 6. The second trimester is often a time when you feel your best. Your body has also adjusted to being pregnant, and you begin to feel better physically. Usually, morning sickness has lessened or quit completely, you may have more energy, and you may have an increase in appetite. The second trimester is also a time when the fetus is growing rapidly. At the end of the sixth month, the fetus is about 9 inches long and weighs about 1 pounds. You will likely begin to feel the baby move (quickening) between 18 and 20 weeks of the pregnancy. BODY CHANGES Your body goes through many changes during pregnancy. The changes vary from woman to woman.   Your weight will continue to increase. You will notice your lower abdomen bulging out.  You may begin to get stretch marks on your hips, abdomen, and breasts.  You may develop headaches that can be relieved by medicines approved by your health care provider.  You may urinate more often because the fetus is pressing on your bladder.  You may develop or continue to have heartburn as a result of your pregnancy.  You may develop constipation because certain hormones are causing the muscles that push waste through your intestines to slow down.  You may develop hemorrhoids or swollen, bulging veins (varicose veins).  You may have back pain because of the weight gain and pregnancy hormones relaxing your joints between the bones in your pelvis and as a result of a shift in weight and the muscles that support your balance.  Your breasts will continue to grow and be tender.  Your gums may bleed and may be sensitive to brushing and flossing.  Dark spots or blotches (chloasma, mask of pregnancy) may develop on your face. This will  likely fade after the baby is born.  A dark line from your belly button to the pubic area (linea nigra) may appear. This will likely fade after the baby is born.  You may have changes in your hair. These can include thickening of your hair, rapid growth, and changes in texture. Some women also have hair loss during or after pregnancy, or hair that feels dry or thin. Your hair will most likely return to normal after your baby is born. WHAT TO EXPECT AT YOUR PRENATAL VISITS During a routine prenatal visit:  You will be weighed to make sure you and the fetus are growing normally.  Your blood pressure will be taken.  Your abdomen will be measured to track your baby's growth.  The fetal heartbeat will be listened to.  Any test results from the previous visit will be discussed. Your health care provider may ask you:  How you are feeling.  If you are feeling the baby move.  If you have had any abnormal symptoms, such as leaking fluid, bleeding, severe headaches, or abdominal cramping.  If you are using any tobacco products, including cigarettes, chewing tobacco, and electronic cigarettes.  If you have any questions. Other tests that may be performed during your second trimester include:  Blood tests that check for:  Low iron levels (anemia).  Gestational diabetes (between 24 and 28 weeks).  Rh antibodies.  Urine tests to check for infections, diabetes, or protein in the urine.  An ultrasound to confirm the proper growth  and development of the baby.  An amniocentesis to check for possible genetic problems.  Fetal screens for spina bifida and Down syndrome.  HIV (human immunodeficiency virus) testing. Routine prenatal testing includes screening for HIV, unless you choose not to have this test. HOME CARE INSTRUCTIONS   Avoid all smoking, herbs, alcohol, and unprescribed drugs. These chemicals affect the formation and growth of the baby.  Do not use any tobacco products,  including cigarettes, chewing tobacco, and electronic cigarettes. If you need help quitting, ask your health care provider. You may receive counseling support and other resources to help you quit.  Follow your health care provider's instructions regarding medicine use. There are medicines that are either safe or unsafe to take during pregnancy.  Exercise only as directed by your health care provider. Experiencing uterine cramps is a good sign to stop exercising.  Continue to eat regular, healthy meals.  Wear a good support bra for breast tenderness.  Do not use hot tubs, steam rooms, or saunas.  Wear your seat belt at all times when driving.  Avoid raw meat, uncooked cheese, cat litter boxes, and soil used by cats. These carry germs that can cause birth defects in the baby.  Take your prenatal vitamins.  Take 1500-2000 mg of calcium daily starting at the 20th week of pregnancy until you deliver your baby.  Try taking a stool softener (if your health care provider approves) if you develop constipation. Eat more high-fiber foods, such as fresh vegetables or fruit and whole grains. Drink plenty of fluids to keep your urine clear or pale yellow.  Take warm sitz baths to soothe any pain or discomfort caused by hemorrhoids. Use hemorrhoid cream if your health care provider approves.  If you develop varicose veins, wear support hose. Elevate your feet for 15 minutes, 3-4 times a day. Limit salt in your diet.  Avoid heavy lifting, wear low heel shoes, and practice good posture.  Rest with your legs elevated if you have leg cramps or low back pain.  Visit your dentist if you have not gone yet during your pregnancy. Use a soft toothbrush to brush your teeth and be gentle when you floss.  A sexual relationship may be continued unless your health care provider directs you otherwise.  Continue to go to all your prenatal visits as directed by your health care provider. SEEK MEDICAL CARE IF:    You have dizziness.  You have mild pelvic cramps, pelvic pressure, or nagging pain in the abdominal area.  You have persistent nausea, vomiting, or diarrhea.  You have a bad smelling vaginal discharge.  You have pain with urination. SEEK IMMEDIATE MEDICAL CARE IF:   You have a fever.  You are leaking fluid from your vagina.  You have spotting or bleeding from your vagina.  You have severe abdominal cramping or pain.  You have rapid weight gain or loss.  You have shortness of breath with chest pain.  You notice sudden or extreme swelling of your face, hands, ankles, feet, or legs.  You have not felt your baby move in over an hour.  You have severe headaches that do not go away with medicine.  You have vision changes.   This information is not intended to replace advice given to you by your health care provider. Make sure you discuss any questions you have with your health care provider.   Document Released: 12/24/2000 Document Revised: 01/20/2014 Document Reviewed: 03/02/2012 Elsevier Interactive Patient Education [employer]2016 Elsevier Inc.

## 2014-11-28 NOTE — Progress Notes (Signed)
Low-risk OB appointment [id number] 4660w0d Estimated Date of Delivery: 04/17/15 BP 104/58 mmHg  Pulse 84  Wt 132 lb (59.875 kg)  LMP  (LMP Unknown)  BP, weight, and urine reviewed.  Refer to obstetrical flow sheet for FH & FHR.  Reports good fm.  Denies regular uc's, lof, vb, or uti s/s. Spider veins bilateral front upper thighs- itch and some vulvar congestion L>R- to get some maternity compression stockings that go over belly Reviewed today's normal anatomy u/s, ptl s/s, fm. Plan:  Continue routine obstetrical care  F/U in 4wks for OB appointment

## 2014-12-26 ENCOUNTER — Ambulatory Visit (INDEPENDENT_AMBULATORY_CARE_PROVIDER_SITE_OTHER): Payer: Medicaid Other | Admitting: Obstetrics & Gynecology

## 2014-12-26 ENCOUNTER — Encounter: Payer: Self-pay | Admitting: Obstetrics & Gynecology

## 2014-12-26 VITALS — BP 100/70 | HR 84 | Wt 137.0 lb

## 2014-12-26 DIAGNOSIS — Z1389 Encounter for screening for other disorder: Secondary | ICD-10-CM

## 2014-12-26 DIAGNOSIS — Z3492 Encounter for supervision of normal pregnancy, unspecified, second trimester: Secondary | ICD-10-CM

## 2014-12-26 DIAGNOSIS — Z331 Pregnant state, incidental: Secondary | ICD-10-CM

## 2014-12-26 LAB — POCT URINALYSIS DIPSTICK
Glucose, UA: NEGATIVE
Ketones, UA: NEGATIVE
Leukocytes, UA: NEGATIVE
Nitrite, UA: NEGATIVE
PROTEIN UA: NEGATIVE
RBC UA: NEGATIVE

## 2014-12-26 NOTE — Progress Notes (Signed)
[id number] 3064w0d Estimated Date of Delivery: 04/17/15  Blood pressure 100/70, pulse 84, weight 137 lb (62.143 kg).   BP weight and urine results all reviewed and noted.  Please refer to the obstetrical flow sheet for the fundal height and fetal heart rate documentation:  Patient reports good fetal movement, denies any bleeding and no rupture of membranes symptoms or regular contractions. Patient is without complaints. All questions were answered.  Orders Placed This Encounter  Procedures  . POCT urinalysis dipstick    Plan:  Continued routine obstetrical care,   Return in about 4 weeks (around 01/23/2015) for Pn2, , LROB.

## 2015-01-14 NOTE — L&D Delivery Note (Signed)
Delivery Note   Patient presented for IOL for ghtn diagnosed day of admission. Did not require magnesium or antihypertensives. Induced with cytotec x2. SROM 18:30 clear. Patient developed recurrent variables treated with amnioinfusion. Variables and late decels persisted through the second stage. Good maternal effort with pushing, second stage lasted ~30 minutes. Total time of rupture ~5 hours. Patient developed triple I prior to delivery and was treated with amp/gent. NICU in attendance for delivery, cord immediately clamped and cut after delivery and transferred to radiant warmer. Infant requiring supplemental O2, will go to NICU.  At 11:54 PM a viable female was delivered via Vaginal, Spontaneous Delivery (Presentation: OA  ).  APGAR: 6/7 ; weight 3500 g.   Placenta status: Intact, Spontaneous.  Cord:  3 vessel. With the following complications: see above.  Cord pH: 7.071  Anesthesia:  epidural Episiotomy:  none Lacerations:  none Est. Blood Loss (mL):  150  Mom to postpartum.  Baby to NICU.  Cherrie Gauzeoah B Wouk 04/22/2015, 12:07 AM

## 2015-01-23 ENCOUNTER — Encounter: Payer: Self-pay | Admitting: Women's Health

## 2015-01-23 ENCOUNTER — Ambulatory Visit (INDEPENDENT_AMBULATORY_CARE_PROVIDER_SITE_OTHER): Payer: Medicaid Other | Admitting: Women's Health

## 2015-01-23 ENCOUNTER — Other Ambulatory Visit: Payer: Medicaid Other

## 2015-01-23 VITALS — BP 108/62 | HR 84 | Wt 143.0 lb

## 2015-01-23 DIAGNOSIS — Z131 Encounter for screening for diabetes mellitus: Secondary | ICD-10-CM

## 2015-01-23 DIAGNOSIS — Z369 Encounter for antenatal screening, unspecified: Secondary | ICD-10-CM

## 2015-01-23 DIAGNOSIS — O09293 Supervision of pregnancy with other poor reproductive or obstetric history, third trimester: Secondary | ICD-10-CM

## 2015-01-23 DIAGNOSIS — Z331 Pregnant state, incidental: Secondary | ICD-10-CM

## 2015-01-23 DIAGNOSIS — Z3493 Encounter for supervision of normal pregnancy, unspecified, third trimester: Secondary | ICD-10-CM

## 2015-01-23 DIAGNOSIS — Z1389 Encounter for screening for other disorder: Secondary | ICD-10-CM

## 2015-01-23 DIAGNOSIS — Z8632 Personal history of gestational diabetes: Secondary | ICD-10-CM | POA: Insufficient documentation

## 2015-01-23 LAB — POCT URINALYSIS DIPSTICK
GLUCOSE UA: NEGATIVE
Ketones, UA: NEGATIVE
LEUKOCYTES UA: NEGATIVE
NITRITE UA: NEGATIVE
Protein, UA: NEGATIVE
RBC UA: NEGATIVE

## 2015-01-23 NOTE — Patient Instructions (Signed)

## 2015-01-23 NOTE — Progress Notes (Signed)
Low-risk OB appointment [id number] 2130w0d Estimated Date of Delivery: 04/17/15 BP 108/62 mmHg  Pulse 84  Wt 143 lb (64.864 kg)  LMP  (LMP Unknown)  BP, weight, and urine reviewed.  Refer to obstetrical flow sheet for FH & FHR.  Reports good fm.  Denies regular uc's, lof, vb, or uti s/s. Cough at night x 2wks- had cold earlier, dry cough lingering, is able to sleep fine once she gets to sleep. No other sx. To try humidifier, cough drops, etc. Let us know if not improving.  Tells me today she had A1DM w/ last pregnancy.  Reviewed ptl s/s, fkc.Recommended Tdap at HD/PCP per CDC guidelines.  Plan:  Continue routine obstetrical care  F/U in 4wks for OB appointment  PN2 today

## 2015-01-24 LAB — ANTIBODY SCREEN: Antibody Screen: NEGATIVE

## 2015-01-24 LAB — CBC
HEMATOCRIT: 32.2 % — AB (ref 34.0–46.6)
HEMOGLOBIN: 11 g/dL — AB (ref 11.1–15.9)
MCH: 31.5 pg (ref 26.6–33.0)
MCHC: 34.2 g/dL (ref 31.5–35.7)
MCV: 92 fL (ref 79–97)
Platelets: 308 10*3/uL (ref 150–379)
RBC: 3.49 x10E6/uL — ABNORMAL LOW (ref 3.77–5.28)
RDW: 12.5 % (ref 12.3–15.4)
WBC: 9.8 10*3/uL (ref 3.4–10.8)

## 2015-01-24 LAB — RPR: RPR Ser Ql: NONREACTIVE

## 2015-01-24 LAB — GLUCOSE TOLERANCE, 2 HOURS W/ 1HR
GLUCOSE, 2 HOUR: 73 mg/dL (ref 65–152)
GLUCOSE, FASTING: 78 mg/dL (ref 65–91)
Glucose, 1 hour: 94 mg/dL (ref 65–179)

## 2015-01-24 LAB — HIV ANTIBODY (ROUTINE TESTING W REFLEX): HIV Screen 4th Generation wRfx: NONREACTIVE

## 2015-02-11 ENCOUNTER — Inpatient Hospital Stay (HOSPITAL_COMMUNITY)
Admission: AD | Admit: 2015-02-11 | Discharge: 2015-02-11 | Disposition: A | Discharge status: Home / Self Care | Payer: Medicaid Other | Source: Ambulatory Visit | Attending: Obstetrics & Gynecology | Admitting: Obstetrics & Gynecology

## 2015-02-11 ENCOUNTER — Encounter (HOSPITAL_COMMUNITY): Payer: Self-pay

## 2015-02-11 DIAGNOSIS — Z3A30 30 weeks gestation of pregnancy: Secondary | ICD-10-CM | POA: Diagnosis not present

## 2015-02-11 DIAGNOSIS — Z87891 Personal history of nicotine dependence: Secondary | ICD-10-CM | POA: Insufficient documentation

## 2015-02-11 DIAGNOSIS — O219 Vomiting of pregnancy, unspecified: Secondary | ICD-10-CM | POA: Diagnosis not present

## 2015-02-11 DIAGNOSIS — O212 Late vomiting of pregnancy: Secondary | ICD-10-CM | POA: Diagnosis present

## 2015-02-11 LAB — URINALYSIS, ROUTINE W REFLEX MICROSCOPIC
Bilirubin Urine: NEGATIVE
GLUCOSE, UA: NEGATIVE mg/dL
HGB URINE DIPSTICK: NEGATIVE
KETONES UR: NEGATIVE mg/dL
Leukocytes, UA: NEGATIVE
Nitrite: NEGATIVE
PROTEIN: NEGATIVE mg/dL
Specific Gravity, Urine: 1.02 (ref 1.005–1.030)
pH: 7.5 (ref 5.0–8.0)

## 2015-02-11 MED ORDER — ONDANSETRON 8 MG PO TBDP
8.0000 mg | ORAL_TABLET | Freq: Once | ORAL | Status: AC
  Administered 2015-02-11: 8 mg via ORAL
  Filled 2015-02-11: qty 1

## 2015-02-11 MED ORDER — ONDANSETRON 8 MG PO TBDP: 8.0000 mg | ORAL_TABLET | Freq: Once | ORAL | Status: DC

## 2015-02-11 NOTE — MAU Note (Signed)
Per Zerita Boers CNM d/c fetal monitoring, fhr reactive.

## 2015-02-11 NOTE — Discharge Instructions (Signed)

## 2015-02-11 NOTE — Progress Notes (Signed)
Student MAU Note  Chief Complaint:  No chief complaint on file.   Kim Carrillo is  35 y.o. [phone], who presents with known pregnancy at 30w.  She presents complaining of 3 days of nausea and vomiting.  2 days ago, Kim Carrillo developed "constant morning sickness" and vomited twice. Nausea persisted, she had another episode of emesis today, and she felt like she couldn't stand it. Endorses reduced fluid and food intake. Has been able to keep down Diclegis, which is helping with nausea. She denies fever, diarrhea, other associated symptoms, vaginal bleeding, discharge, and contractions. Emesis non-bloody. No black or red stools. Continues to have good fetal movement and endorses braxton-hicks contractions. Had morning sickness earlier in pregnancy and this reminds her a lot of that. Step-child possible sick contact (recently ill but unsure what symptoms). Has not eaten any usual foods.  Obstetrical/Gynecological History: OB History    Gravida Para Term Preterm AB TAB SAB Ectopic Multiple Living   0 1 1 0 0 0 1      Past Medical History: Past Medical History  Diagnosis Date  . Anxiety   . Migraine headache   . Complication of anesthesia     didn't fall asleep during sigmoidoscopy  . ANXIETY DEPRESSION 11/23/2006    Qualifier: Diagnosis of  By: Genelle Gather CMA, [place]    . ANAL FISSURE, HX OF 11/23/2006    Qualifier: Diagnosis of  By: Genelle Gather CMA, [place]    . MIGRAINE HEADACHE 11/23/2006    Qualifier: Diagnosis of  By: Genelle Gather CMA, [place]    . Gestational diabetes     Past Surgical History: Past Surgical History  Procedure Laterality Date  . Ovarian cyst surgery    . Dilation and curettage of uterus  2008    TAB X 1    Family History: Family History  Problem Relation Age of Onset  . Hypertension Mother     Social History: Social History  Substance Use Topics  . Smoking status: Former Smoker -- 2 years    Types: Cigarettes  . Smokeless tobacco: Never Used  .  Alcohol Use: No    Allergies: No Known Allergies  Prescriptions prior to admission  Medication Sig Dispense Refill Last Dose  . acetaminophen (TYLENOL) 325 MG tablet Take 650 mg by mouth every 6 (six) hours as needed.   Past Week at Unknown time  . calcium carbonate (TUMS - DOSED IN MG ELEMENTAL CALCIUM) 500 MG chewable tablet Chew 1 tablet by mouth daily.   02/10/2015 at Unknown time  . Doxylamine-Pyridoxine 10-10 MG TBEC 2 PO qhs; may take 1po in am and 1po in afternoon prn nausea 120 tablet 3 Past Week at Unknown time  . Prenat w/o A FeCbnFeGlu-FA &B6 (CITRANATAL B-CALM) 20-1 & 25 (2) MG MISC Take 1 tablet by mouth 3 (three) times daily. 90 each 5 Past Week at Unknown time    Review of Systems - See HPI   Physical Exam   Blood pressure 124/86, pulse 104, temperature 98.1 F (36.7 C), temperature source Oral, resp. rate 18.  General: General appearance - alert, tired appearing, no acute distress  Heart - tachycardic with regular Kim, normal S1S2, no murmurs rubs or gallops Abdomen - soft, non-tender, hard fundus Pelvic - Cervix hard and closed Extremities - Warm, acyanotic, 1+ dorsalis pedis, tender bruises on R lower leg and upper leg that pt reports may be varicose veins and denies trauma  FHR - 130s-150s  Assessment: Kim Carrillo is a  34yo [id number] with current pregnancy at 30w who presents with constant nausea, multiple episodes of emesis, and able to keep down some food/fluids. In the MAU, she was found to be tachycardic, non-acute abdomen with a closed, hard cervix on exam. Nausea likely 2/2 morning sickness.   Differential also includes acute gastrointestinal infection, food poisoning, cholelithiasis, cholecystitis, pancreatitis, and appendicitis. AGI/food poisoning less likely given lack of diarrhea. Cholelithiasis unlikely given constant nausea. Pancreatitis possible given hx of gallstones but unlikely with no epigastric pain and ability to tolerate some po intact.  Appendicitis and cholecystitis also unlikely given lack of fever and abdominal pain pain.  Patient Active Problem List   Diagnosis Date Noted  . History of gestational diabetes in prior pregnancy, currently pregnant 01/23/2015  . Supervision of normal pregnancy 10/03/2014  . Gallstones 05/07/2012  . Gastroenteritis 05/07/2012  . Cough 05/07/2012  . HEMORRHOIDS-EXTERNAL 11/09/2007  . RECTAL BLEEDING 11/09/2007  . ANXIETY DEPRESSION 11/23/2006  . MIGRAINE HEADACHE 11/23/2006  . OVARIAN CYST 11/23/2006  . ANAL FISSURE, HX OF 11/23/2006  . OVARIAN CYSTECTOMY, HX OF 11/23/2006    Plan: - Give Zofran. Encourage fluids after Zofran. If able to tolerate po fluids, discharge with Zofran. If unable to tolerate po fluids, consider IV fluids as patient is tachycardic.  - Consider CMP if no improvement with Zofran or fluids  Kim Carrillo

## 2015-02-16 ENCOUNTER — Inpatient Hospital Stay (HOSPITAL_COMMUNITY)
Admission: AD | Admit: 2015-02-16 | Discharge: 2015-02-16 | Disposition: A | Discharge status: Home / Self Care | Payer: Medicaid Other | Source: Ambulatory Visit | Attending: Family Medicine | Admitting: Family Medicine

## 2015-02-16 ENCOUNTER — Encounter (HOSPITAL_COMMUNITY): Payer: Self-pay | Admitting: *Deleted

## 2015-02-16 DIAGNOSIS — O26813 Pregnancy related exhaustion and fatigue, third trimester: Secondary | ICD-10-CM | POA: Insufficient documentation

## 2015-02-16 DIAGNOSIS — Z3A31 31 weeks gestation of pregnancy: Secondary | ICD-10-CM | POA: Diagnosis not present

## 2015-02-16 DIAGNOSIS — Z87891 Personal history of nicotine dependence: Secondary | ICD-10-CM | POA: Insufficient documentation

## 2015-02-16 DIAGNOSIS — R5381 Other malaise: Secondary | ICD-10-CM | POA: Diagnosis not present

## 2015-02-16 DIAGNOSIS — R5383 Other fatigue: Secondary | ICD-10-CM

## 2015-02-16 LAB — CBC WITH DIFFERENTIAL/PLATELET
BASOS PCT: 0 %
Basophils Absolute: 0 10*3/uL (ref 0.0–0.1)
EOS ABS: 0.2 10*3/uL (ref 0.0–0.7)
EOS PCT: 1 %
HCT: 30.9 % — ABNORMAL LOW (ref 36.0–46.0)
Hemoglobin: 10 g/dL — ABNORMAL LOW (ref 12.0–15.0)
LYMPHS ABS: 1.9 10*3/uL (ref 0.7–4.0)
Lymphocytes Relative: 18 %
MCH: 30 pg (ref 26.0–34.0)
MCHC: 32.4 g/dL (ref 30.0–36.0)
MCV: 92.8 fL (ref 78.0–100.0)
MONOS PCT: 8 %
Monocytes Absolute: 0.9 10*3/uL (ref 0.1–1.0)
Neutro Abs: 7.7 10*3/uL (ref 1.7–7.7)
Neutrophils Relative %: 73 %
PLATELETS: 266 10*3/uL (ref 150–400)
RBC: 3.33 MIL/uL — ABNORMAL LOW (ref 3.87–5.11)
RDW: 13.5 % (ref 11.5–15.5)
WBC: 10.6 10*3/uL — ABNORMAL HIGH (ref 4.0–10.5)

## 2015-02-16 LAB — COMPREHENSIVE METABOLIC PANEL
ALT: 13 U/L — ABNORMAL LOW (ref 14–54)
ANION GAP: 10 (ref 5–15)
AST: 20 U/L (ref 15–41)
Albumin: 2.7 g/dL — ABNORMAL LOW (ref 3.5–5.0)
Alkaline Phosphatase: 99 U/L (ref 38–126)
BUN: 6 mg/dL (ref 6–20)
CALCIUM: 8.2 mg/dL — AB (ref 8.9–10.3)
CHLORIDE: 100 mmol/L — AB (ref 101–111)
CO2: 23 mmol/L (ref 22–32)
Creatinine, Ser: 0.48 mg/dL (ref 0.44–1.00)
GFR calc non Af Amer: >60 mL/min (ref >60)
Glucose, Bld: 108 mg/dL — ABNORMAL HIGH (ref 65–99)
POTASSIUM: 3.9 mmol/L (ref 3.5–5.1)
SODIUM: 133 mmol/L — AB (ref 135–145)
Total Bilirubin: 0.4 mg/dL (ref 0.3–1.2)
Total Protein: 5.8 g/dL — ABNORMAL LOW (ref 6.5–8.1)

## 2015-02-16 LAB — URINALYSIS, ROUTINE W REFLEX MICROSCOPIC
BILIRUBIN URINE: NEGATIVE
Glucose, UA: NEGATIVE mg/dL
Hgb urine dipstick: NEGATIVE
KETONES UR: NEGATIVE mg/dL
LEUKOCYTES UA: NEGATIVE
NITRITE: NEGATIVE
PROTEIN: NEGATIVE mg/dL
Specific Gravity, Urine: 1.01 (ref 1.005–1.030)
pH: 5.5 (ref 5.0–8.0)

## 2015-02-16 LAB — TSH: TSH: 0.969 u[IU]/mL (ref 0.350–4.500)

## 2015-02-16 NOTE — MAU Note (Signed)
Patient states that every time she eats she feels like she is going to pass out has been going on for several months.

## 2015-02-16 NOTE — Discharge Instructions (Signed)
Fatigue Fatigue is feeling tired all of the time, a lack of energy, or a lack of motivation. Occasional or mild fatigue is often a normal response to activity or life in general. However, long-lasting (chronic) or extreme fatigue may indicate an underlying medical condition. HOME CARE INSTRUCTIONS  Watch your fatigue for any changes. The following actions may help to lessen any discomfort you are feeling:  Talk to your health care provider about how much sleep you need each night. Try to get the required amount every night.  Take medicines only as directed by your health care provider.  Eat a healthy and nutritious diet. Ask your health care provider if you need help changing your diet.  Drink enough fluid to keep your urine clear or pale yellow.  Practice ways of relaxing, such as yoga, meditation, massage therapy, or acupuncture.  Exercise regularly.   Change situations that cause you stress. Try to keep your work and personal routine reasonable.  Do not abuse illegal drugs.  Limit alcohol intake to no more than 1 drink per day for nonpregnant women and 2 drinks per day for men. One drink equals 12 ounces of beer, 5 ounces of wine, or 1 ounces of hard liquor.  Take a multivitamin, if directed by your health care provider. SEEK MEDICAL CARE IF:   Your fatigue does not get better.  You have a fever.   You have unintentional weight loss or gain.  You have headaches.   You have difficulty:   Falling asleep.  Sleeping throughout the night.  You feel angry, guilty, anxious, or sad.   You are unable to have a bowel movement (constipation).   You skin is dry.   Your legs or another part of your body is swollen.  SEEK IMMEDIATE MEDICAL CARE IF:   You feel confused.   Your vision is blurry.  You feel faint or pass out.   You have a severe headache.   You have severe abdominal, pelvic, or back pain.   You have chest pain, shortness of breath, or an  irregular or fast heartbeat.   You are unable to urinate or you urinate less than normal.   You develop abnormal bleeding, such as bleeding from the rectum, vagina, nose, lungs, or nipples.  You vomit blood.   You have thoughts about harming yourself or committing suicide.   You are worried that you might harm someone else.    This information is not intended to replace advice given to you by your health care provider. Make sure you discuss any questions you have with your health care provider.   Document Released: 10/27/2006 Document Revised: 01/20/2014 Document Reviewed: 05/03/2013 Elsevier Interactive Patient Education 2016 [employer].  Near-Syncope Near-syncope (commonly known as near fainting) is sudden weakness, dizziness, or feeling like you might pass out. During an episode of near-syncope, you may also develop pale skin, have tunnel vision, or feel sick to your stomach (nauseous). Near-syncope may occur when getting up after sitting or while standing for a long time. It is caused by a sudden decrease in blood flow to the brain. This decrease can result from various causes or triggers, most of which are not serious. However, because near-syncope can sometimes be a sign of something serious, a medical evaluation is required. The specific cause is often not determined. HOME CARE INSTRUCTIONS  Monitor your condition for any changes. The following actions may help to alleviate any discomfort you are experiencing:  Have someone stay with you until  you feel stable.  Lie down right away and prop your feet up if you start feeling like you might faint. Breathe deeply and steadily. Wait until all the symptoms have passed. Most of these episodes last only a few minutes. You may feel tired for several hours.   Drink enough fluids to keep your urine clear or pale yellow.   If you are taking blood pressure or heart medicine, get up slowly when seated or lying down. Take several  minutes to sit and then stand. This can reduce dizziness.  Follow up with your health care provider as directed. SEEK IMMEDIATE MEDICAL CARE IF:   You have a severe headache.   You have unusual pain in the chest, abdomen, or back.   You are bleeding from the mouth or rectum, or you have black or tarry stool.   You have an irregular or very fast heartbeat.   You have repeated fainting or have seizure-like jerking during an episode.   You faint when sitting or lying down.   You have confusion.   You have difficulty walking.   You have severe weakness.   You have vision problems.  MAKE SURE YOU:   Understand these instructions.  Will watch your condition.  Will get help right away if you are not doing well or get worse.   This information is not intended to replace advice given to you by your health care provider. Make sure you discuss any questions you have with your health care provider.   Document Released: 12/30/2004 Document Revised: 01/04/2013 Document Reviewed: 06/04/2012 Elsevier Interactive Patient Education [employer]

## 2015-02-16 NOTE — MAU Provider Note (Signed)
Chief Complaint:  Fatigue   First Provider Initiated Contact with Patient 02/16/15 1622     HPI   Kim Carrillo is a 35 y.o. G3P1011 at 81w3dwho presents to maternity admissions reporting episodes of feeling faint and tired as soon as she eats something. States it happens every time she eats.  Gets lethargic, weak, and tired feeling. Feels tired and lacks energy all day, seems to be associated with eating.  Sometimes has to sit up in bed and breathe fast.   States is worried something is very wrong. Was seen earlier for presumed gastroenteritis and this has mostly resolved.   She reports good fetal movement, denies LOF, vaginal bleeding, vaginal itching/burning, urinary symptoms, h/a, n/v, diarrhea, constipation or fever/chills.  She denies headache, visual changes or abdominal pain.  History remarkable for anxiety and depression   Was on daily Xanax before pregnancy.  Has gallstones found incidentally with no cholecystitis, never removed gallbladder (never went back to surgeon) Had gestational diabetes last pregnancy (2008) but test was negative this time.  Followed at [facility].  RN Note: Patient states that every time she eats she feels like she is going to pass out has been going on for several months.            Past Medical History: Past Medical History  Diagnosis Date  . Anxiety   . Migraine headache   . Complication of anesthesia     didn't fall asleep during sigmoidoscopy  . ANXIETY DEPRESSION 11/23/2006    Qualifier: Diagnosis of  By: Genelle Gather CMA, [place]    . ANAL FISSURE, HX OF 11/23/2006    Qualifier: Diagnosis of  By: Genelle Gather CMA, [place]    . MIGRAINE HEADACHE 11/23/2006    Qualifier: Diagnosis of  By: Genelle Gather CMA, [place]    . Gestational diabetes     Past obstetric history: OB History  Gravida Para Term Preterm AB SAB TAB Ectopic Multiple Living  0 1 0 1 0 0 1    # Outcome Date GA Lbr Len/2nd Weight Sex Delivery Anes PTL Lv  3 Current            2 TAB 05/19/06          1 Term 05/19/06 [redacted]w[redacted]d  6 lb 9 oz (2.977 kg)  Vag-Spont   Y      Past Surgical History: Past Surgical History  Procedure Laterality Date  . Ovarian cyst surgery    . Dilation and curettage of uterus  2008    TAB X 1    Family History: Family History  Problem Relation Age of Onset  . Hypertension Mother     Social History: Social History  Substance Use Topics  . Smoking status: Former Smoker -- 2 years    Types: Cigarettes  . Smokeless tobacco: Never Used  . Alcohol Use: No    Allergies: No Known Allergies  Meds:  Prescriptions prior to admission  Medication Sig Dispense Refill Last Dose  . acetaminophen (TYLENOL) 325 MG tablet Take 650 mg by mouth every 6 (six) hours as needed for headache.    Past Month at Unknown time  . calcium carbonate (TUMS - DOSED IN MG ELEMENTAL CALCIUM) 500 MG chewable tablet Chew 1 tablet by mouth daily.   Past Week at Unknown time  . Doxylamine-Pyridoxine 10-10 MG TBEC 2 PO qhs; may take 1po in am and 1po in afternoon prn nausea (Patient taking differently: Take 1-2 tablets by mouth 2 (two) times  daily. 2 PO qhs; may take 1po in am and 1po in afternoon prn nausea) 120 tablet 3 02/16/2015 at Unknown time  . ondansetron (ZOFRAN-ODT) 8 MG disintegrating tablet Take 1 tablet (8 mg total) by mouth once. 20 tablet 2 Past Week at Unknown time  . Prenat w/o A FeCbnFeGlu-FA &B6 (CITRANATAL B-CALM) 20-1 & 25 (2) MG MISC Take 1 tablet by mouth 3 (three) times daily. 90 each 5 02/15/2015 at Unknown time   I have reviewed patient's Past Medical Hx, Surgical Hx, Family Hx, Social Hx, medications and allergies.   ROS:  Review of Systems  Constitutional: Positive for fatigue. Negative for fever, chills, activity change and appetite change.  Respiratory: Negative for shortness of breath.   Cardiovascular: Negative for palpitations.  Gastrointestinal: Positive for nausea (at times). Negative for vomiting, abdominal pain, diarrhea and  constipation.  Genitourinary: Negative for vaginal bleeding, vaginal discharge, difficulty urinating and pelvic pain.  Musculoskeletal: Negative for myalgias and back pain.  Neurological: Positive for weakness and light-headedness. Negative for dizziness, syncope and headaches.  Psychiatric/Behavioral: The patient is nervous/anxious (at times).     Physical Exam  Patient Vitals for the past 24 hrs:  BP Temp Temp src Pulse Resp SpO2  02/16/15 1532 111/77 mmHg 98.9 F (37.2 C) Oral 99 18 99 %   Filed Vitals:   02/16/15 1532 02/16/15 1802  BP: 111/77 119/68  Pulse: 99 100  Temp: 98.9 F (37.2 C)   TempSrc: Oral   Resp: 18   SpO2: 99%     Constitutional: Well-developed, well-nourished female in no acute distress.  Cardiovascular: normal rate and rhythm, slightly rapid, but slows when patient relaxes Respiratory: normal effort, clear to auscultate bilaterally GI: Abd soft, non-tender, gravid appropriate for gestational age.   No rebound or guarding. MS: Extremities nontender, no edema, normal ROM Neurologic: Alert and oriented x 4.  GU: Neg CVAT.    FHT:  Baseline 140 , moderate variability, accelerations present, no decelerations Contractions: Rare   Labs: O/Positive/-- (09/20 1650) Results for orders placed or performed during the hospital encounter of 02/16/15 (from the past 24 hour(s))  Urinalysis, Routine w reflex microscopic (not at [facility])     Status: None   Collection Time: 02/16/15  3:44 PM  Result Value Ref Range   Color, Urine YELLOW YELLOW   APPearance CLEAR CLEAR   Specific Gravity, Urine 1.010 1.005 - 1.030   pH 5.5 5.0 - 8.0   Glucose, UA NEGATIVE NEGATIVE mg/dL   Hgb urine dipstick NEGATIVE NEGATIVE   Bilirubin Urine NEGATIVE NEGATIVE   Ketones, ur NEGATIVE NEGATIVE mg/dL   Protein, ur NEGATIVE NEGATIVE mg/dL   Nitrite NEGATIVE NEGATIVE   Leukocytes, UA NEGATIVE NEGATIVE  CBC with Differential     Status: Abnormal   Collection Time: 02/16/15  4:51 PM   Result Value Ref Range   WBC 10.6 (H) 4.0 - 10.5 K/uL   RBC 3.33 (L) 3.87 - 5.11 MIL/uL   Hemoglobin 10.0 (L) 12.0 - 15.0 g/dL   HCT 21.3 (L) 08.6 - 57.8 %   MCV 92.8 78.0 - 100.0 fL   MCH 30.0 26.0 - 34.0 pg   MCHC 32.4 30.0 - 36.0 g/dL   RDW 46.9 62.9 - 52.8 %   Platelets 266 150 - 400 K/uL   Neutrophils Relative % 73 %   Neutro Abs 7.7 1.7 - 7.7 K/uL   Lymphocytes Relative 18 %   Lymphs Abs 1.9 0.7 - 4.0 K/uL   Monocytes Relative 8 %  Monocytes Absolute 0.9 0.1 - 1.0 K/uL   Eosinophils Relative 1 %   Eosinophils Absolute 0.2 0.0 - 0.7 K/uL   Basophils Relative 0 %   Basophils Absolute 0.0 0.0 - 0.1 K/uL  Comprehensive metabolic panel     Status: Abnormal   Collection Time: 02/16/15  4:51 PM  Result Value Ref Range   Sodium 133 (L) 135 - 145 mmol/L   Potassium 3.9 3.5 - 5.1 mmol/L   Chloride 100 (L) 101 - 111 mmol/L   CO2 23 22 - 32 mmol/L   Glucose, Bld 108 (H) 65 - 99 mg/dL   BUN 6 6 - 20 mg/dL   Creatinine, Ser 1.61 0.44 - 1.00 mg/dL   Calcium 8.2 (L) 8.9 - 10.3 mg/dL   Total Protein 5.8 (L) 6.5 - 8.1 g/dL   Albumin 2.7 (L) 3.5 - 5.0 g/dL   AST 20 15 - 41 U/L   ALT 13 (L) 14 - 54 U/L   Alkaline Phosphatase 99 38 - 126 U/L   Total Bilirubin 0.4 0.3 - 1.2 mg/dL   GFR calc non Af Amer >60 >60 mL/min   GFR calc Af Amer >60 >60 mL/min   Anion gap 10 5 - 15    Imaging:  No results found.  MAU Course/MDM: I have ordered labs and reviewed results.  NST reviewed and was reactive/reassuring.  Discussed with patient her urine shows no dehydration.  Her blood sugar is normal.   CBC and CMET are normal.  No signs of cholecystitis.   Orthostatic VS were normal.. 12 lead EKG was normal sinus rhythm. Discussed relaxation techniques for panic attacks and anxiety reduction. May need to go back to [facility] professional.   Consult Dr Despina Hidden with presentation, exam findings and test results.   He recommends trial of Lexapro. She adamantly refuses to try this due to pregnancy,  despite reassurance.  I did tell her they can sometimes prescribe anti-anxiety meds since Xanax is not recommended in pregnancy. Agrees to call her Mental Health specialist.  We also discussed possible reordering of strips so she can check her blood sugars during her episodes.  Does not remember what kind of meter she has.   Treatments in MAU included none.   Pt stable at time of discharge.   Assessment: SIUP at [redacted]w[redacted]d  Malaise and fatigue Episodes of feeling weak/tired after eating  Plan: Discharge home I have added a TSH to her blood work to rule out Hypothyroidism.  >>.  .  Ref. Range 02/16/2015 16:51  TSH Latest Ref Range: 0.350-4.500 uIU/mL 0.969    Labor precautions and fetal kick counts Follow up in Office for prenatal visits and recheck    Medication List    ASK your doctor about these medications        acetaminophen 325 MG tablet  Commonly known as:  TYLENOL  Take 650 mg by mouth every 6 (six) hours as needed for headache.     calcium carbonate 500 MG chewable tablet  Commonly known as:  TUMS - dosed in mg elemental calcium  Chew 1 tablet by mouth daily.     CITRANATAL B-CALM 20-1 & 25 (2) MG Misc  Take 1 tablet by mouth 3 (three) times daily.     Doxylamine-Pyridoxine 10-10 MG Tbec  2 PO qhs; may take 1po in am and 1po in afternoon prn nausea     ondansetron 8 MG disintegrating tablet  Commonly known as:  ZOFRAN-ODT  Take 1 tablet (8 mg total)  by mouth once.        Wynelle Bourgeois CNM, MSN Certified Nurse-Midwife 02/16/2015 4:23 PM

## 2015-02-20 ENCOUNTER — Encounter: Payer: Self-pay | Admitting: Women's Health

## 2015-02-20 ENCOUNTER — Ambulatory Visit (INDEPENDENT_AMBULATORY_CARE_PROVIDER_SITE_OTHER): Payer: Medicaid Other | Admitting: Women's Health

## 2015-02-20 VITALS — BP 106/62 | HR 84 | Wt 146.0 lb

## 2015-02-20 DIAGNOSIS — Z1389 Encounter for screening for other disorder: Secondary | ICD-10-CM

## 2015-02-20 DIAGNOSIS — Z331 Pregnant state, incidental: Secondary | ICD-10-CM

## 2015-02-20 DIAGNOSIS — Z3493 Encounter for supervision of normal pregnancy, unspecified, third trimester: Secondary | ICD-10-CM

## 2015-02-20 NOTE — Patient Instructions (Addendum)
[place] Pediatricians/Family Doctors:  [facility] [phone]            [facility] [phone]                 [facility] [phone] (usually not accepting new patients unless you have family there already, you are always welcome to call and ask)            [facility] [phone] 3rd Ileene Patrick) [phone]   [facility] Pediatricians/Family Doctors:   [facility]: [phone]  [facility]: [phone]   Tdap Vaccine  It is recommended that you get the Tdap vaccine during the third trimester of EACH pregnancy to help protect your baby from getting pertussis (whooping cough)  27-36 weeks is the BEST time to do this so that you can pass the protection on to your baby. During pregnancy is better than after pregnancy, but if you are unable to get it during pregnancy it will be offered at the hospital.   You can get this vaccine at the health department or your family doctor  Everyone who will be around your baby should also be up-to-date on their vaccines. Adults (who are not pregnant) only need 1 dose of Tdap during adulthood.      Call the office [phone]) or go to [facility] if:  You begin to have strong, frequent contractions  Your water breaks.  Sometimes it is a big gush of fluid, sometimes it is just a trickle that keeps getting your panties wet or running down your legs  You have vaginal bleeding.  It is normal to have a small amount of spotting if your cervix was checked.   You don't feel your baby moving like normal.  If you don't, get you something to eat and drink and lay down and focus on feeling your baby move.  You should feel at least 10 movements in 2 hours.  If you don't, you should call the office or go to [facility].    Preterm Labor Information Preterm labor is when labor starts at less than 37 weeks of pregnancy. The normal length of a pregnancy is 39 to 41  weeks. CAUSES Often, there is no identifiable underlying cause as to why a woman goes into preterm labor. One of the most common known causes of preterm labor is infection. Infections of the uterus, cervix, vagina, amniotic sac, bladder, kidney, or even the lungs (pneumonia) can cause labor to start. Other suspected causes of preterm labor include:  9. Urogenital infections, such as yeast infections and bacterial vaginosis.  10. Uterine abnormalities (uterine shape, uterine septum, fibroids, or bleeding from the placenta).  11. A cervix that has been operated on (it may fail to stay closed).  12. Malformations in the fetus.  13. Multiple gestations (twins, triplets, and so on).  14. Breakage of the amniotic sac.  RISK FACTORS 3. Having a previous history of preterm labor.  4. Having premature rupture of membranes (PROM).  5. Having a placenta that covers the opening of the cervix (placenta previa).  6. Having a placenta that separates from the uterus (placental abruption).  7. Having a cervix that is too weak to hold the fetus in the uterus (incompetent cervix).  8. Having too much fluid in the amniotic sac (polyhydramnios).  9. Taking illegal drugs or smoking while pregnant.  10. Not gaining enough weight while pregnant.  11. Being younger than 78 and older than 36 years old.  12.  Having a low socioeconomic status.  13. Being African American. SYMPTOMS Signs and symptoms of preterm labor include:   Menstrual-like cramps, abdominal pain, or back pain.  Uterine contractions that are regular, as frequent as six in an hour, regardless of their intensity (may be mild or painful).  Contractions that start on the top of the uterus and spread down to the lower abdomen and back.   A sense of increased pelvic pressure.   A watery or bloody mucus discharge that comes from the vagina.  TREATMENT Depending on the length of the pregnancy and other circumstances, your health  care provider may suggest bed rest. If necessary, there are medicines that can be given to stop contractions and to mature the fetal lungs. If labor happens before 34 weeks of pregnancy, a prolonged hospital stay may be recommended. Treatment depends on the condition of both you and the fetus.  WHAT SHOULD YOU DO IF YOU THINK YOU ARE IN PRETERM LABOR? Call your health care provider right away. You will need to go to the hospital to get checked immediately. HOW CAN YOU PREVENT PRETERM LABOR IN FUTURE PREGNANCIES? You should:   Stop smoking if you smoke.  Maintain healthy weight gain and avoid chemicals and drugs that are not necessary.  Be watchful for any type of infection.  Inform your health care provider if you have a known history of preterm labor.   This information is not intended to replace advice given to you by your health care provider. Make sure you discuss any questions you have with your health care provider.   Document Released: 03/22/2003 Document Revised: 09/01/2012 Document Reviewed: 02/02/2012 Elsevier Interactive Patient Education [employer]

## 2015-02-20 NOTE — Progress Notes (Signed)
Low-risk OB appointment G3P1011 [redacted]w[redacted]d Estimated Date of Delivery: 04/17/15 BP 106/62 mmHg  Pulse 84  Wt 146 lb (66.225 kg)  LMP  (LMP Unknown)  BP, weight, and urine reviewed.  Refer to obstetrical flow sheet for FH & FHR.  Reports good fm.  Denies regular uc's, lof, vb, or uti s/s. No complaints. Reviewed normal pn2 results, ptl s/s, fkc. Plan:  Continue routine obstetrical care  F/U in 2wks for OB appointment

## 2015-03-06 ENCOUNTER — Ambulatory Visit (INDEPENDENT_AMBULATORY_CARE_PROVIDER_SITE_OTHER): Payer: Medicaid Other | Admitting: Advanced Practice Midwife

## 2015-03-06 VITALS — BP 120/76 | HR 104 | Wt 147.5 lb

## 2015-03-06 DIAGNOSIS — Z3A34 34 weeks gestation of pregnancy: Secondary | ICD-10-CM

## 2015-03-06 DIAGNOSIS — Z3493 Encounter for supervision of normal pregnancy, unspecified, third trimester: Secondary | ICD-10-CM

## 2015-03-06 DIAGNOSIS — Z1389 Encounter for screening for other disorder: Secondary | ICD-10-CM

## 2015-03-06 DIAGNOSIS — Z331 Pregnant state, incidental: Secondary | ICD-10-CM

## 2015-03-06 LAB — POCT URINALYSIS DIPSTICK
Blood, UA: NEGATIVE
GLUCOSE UA: NEGATIVE
Ketones, UA: NEGATIVE
LEUKOCYTES UA: NEGATIVE
NITRITE UA: NEGATIVE

## 2015-03-06 MED ORDER — ONDANSETRON 8 MG PO TBDP: 8.0000 mg | ORAL_TABLET | Freq: Once | ORAL | Status: DC

## 2015-03-06 NOTE — Patient Instructions (Addendum)
Buspar (antianxiety) medication  Call the office [phone]) or go to [facility] if:  You begin to have strong, frequent contractions  Your water breaks.  Sometimes it is a big gush of fluid, sometimes it is just a trickle that keeps getting your panties wet or running down your legs  You have vaginal bleeding.  It is normal to have a small amount of spotting if your cervix was checked.   You don't feel your baby moving like normal.  If you don't, get you something to eat and drink and lay down and focus on feeling your baby move.  You should feel at least 10 movements in 2 hours.  If you don't, you should call the office or go to [facility].    Tdap Vaccine  It is recommended that you get the Tdap vaccine during the third trimester of EACH pregnancy to help protect your baby from getting pertussis (whooping cough)  27-36 weeks is the BEST time to do this so that you can pass the protection on to your baby. During pregnancy is better than after pregnancy, but if you are unable to get it during pregnancy it will be offered at the hospital.   You can get this vaccine at the health department or your family doctor  Everyone who will be around your baby should also be up-to-date on their vaccines. Adults (who are not pregnant) only need 1 dose of Tdap during adulthood.   Third Trimester of Pregnancy The third trimester is from week 29 through week 42, months 7 through 9. The third trimester is a time when the fetus is growing rapidly. At the end of the ninth month, the fetus is about 20 inches in length and weighs 6-10 pounds.  BODY CHANGES Your body goes through many changes during pregnancy. The changes vary from woman to woman.   Your weight will continue to increase. You can expect to gain 25-35 pounds (11-16 kg) by the end of the pregnancy.  You may begin to get stretch marks on your hips, abdomen, and breasts.  You may urinate more often because the fetus is moving lower  into your pelvis and pressing on your bladder.  You may develop or continue to have heartburn as a result of your pregnancy.  You may develop constipation because certain hormones are causing the muscles that push waste through your intestines to slow down.  You may develop hemorrhoids or swollen, bulging veins (varicose veins).  You may have pelvic pain because of the weight gain and pregnancy hormones relaxing your joints between the bones in your pelvis. Backaches may result from overexertion of the muscles supporting your posture.  You may have changes in your hair. These can include thickening of your hair, rapid growth, and changes in texture. Some women also have hair loss during or after pregnancy, or hair that feels dry or thin. Your hair will most likely return to normal after your baby is born.  Your breasts will continue to grow and be tender. A yellow discharge may leak from your breasts called colostrum.  Your belly button may stick out.  You may feel short of breath because of your expanding uterus.  You may notice the fetus "dropping," or moving lower in your abdomen.  You may have a bloody mucus discharge. This usually occurs a few days to a week before labor begins.  Your cervix becomes thin and soft (effaced) near your due date. WHAT TO EXPECT AT YOUR PRENATAL EXAMS  You will have prenatal exams every 2 weeks until week 36. Then, you will have weekly prenatal exams. During a routine prenatal visit:  You will be weighed to make sure you and the fetus are growing normally.  Your blood pressure is taken.  Your abdomen will be measured to track your baby's growth.  The fetal heartbeat will be listened to.  Any test results from the previous visit will be discussed.  You may have a cervical check near your due date to see if you have effaced. At around 36 weeks, your caregiver will check your cervix. At the same time, your caregiver will also perform a test on the  secretions of the vaginal tissue. This test is to determine if a type of bacteria, Group B streptococcus, is present. Your caregiver will explain this further. Your caregiver may ask you:  What your birth plan is.  How you are feeling.  If you are feeling the baby move.  If you have had any abnormal symptoms, such as leaking fluid, bleeding, severe headaches, or abdominal cramping.  If you have any questions. Other tests or screenings that may be performed during your third trimester include:  Blood tests that check for low iron levels (anemia).  Fetal testing to check the health, activity level, and growth of the fetus. Testing is done if you have certain medical conditions or if there are problems during the pregnancy. FALSE LABOR You may feel small, irregular contractions that eventually go away. These are called Braxton Hicks contractions, or false labor. Contractions may last for hours, days, or even weeks before true labor sets in. If contractions come at regular intervals, intensify, or become painful, it is best to be seen by your caregiver.  SIGNS OF LABOR   Menstrual-like cramps.  Contractions that are 5 minutes apart or less.  Contractions that start on the top of the uterus and spread down to the lower abdomen and back.  A sense of increased pelvic pressure or back pain.  A watery or bloody mucus discharge that comes from the vagina. If you have any of these signs before the 37th week of pregnancy, call your caregiver right away. You need to go to the hospital to get checked immediately. HOME CARE INSTRUCTIONS   Avoid all smoking, herbs, alcohol, and unprescribed drugs. These chemicals affect the formation and growth of the baby.  Follow your caregiver's instructions regarding medicine use. There are medicines that are either safe or unsafe to take during pregnancy.  Exercise only as directed by your caregiver. Experiencing uterine cramps is a good sign to stop  exercising.  Continue to eat regular, healthy meals.  Wear a good support bra for breast tenderness.  Do not use hot tubs, steam rooms, or saunas.  Wear your seat belt at all times when driving.  Avoid raw meat, uncooked cheese, cat litter boxes, and soil used by cats. These carry germs that can cause birth defects in the baby.  Take your prenatal vitamins.  Try taking a stool softener (if your caregiver approves) if you develop constipation. Eat more high-fiber foods, such as fresh vegetables or fruit and whole grains. Drink plenty of fluids to keep your urine clear or pale yellow.  Take warm sitz baths to soothe any pain or discomfort caused by hemorrhoids. Use hemorrhoid cream if your caregiver approves.  If you develop varicose veins, wear support hose. Elevate your feet for 15 minutes, 3-4 times a day. Limit salt in your diet.  Avoid heavy  lifting, wear low heal shoes, and practice good posture.  Rest a lot with your legs elevated if you have leg cramps or low back pain.  Visit your dentist if you have not gone during your pregnancy. Use a soft toothbrush to brush your teeth and be gentle when you floss.  A sexual relationship may be continued unless your caregiver directs you otherwise.  Do not travel far distances unless it is absolutely necessary and only with the approval of your caregiver.  Take prenatal classes to understand, practice, and ask questions about the labor and delivery.  Make a trial run to the hospital.  Pack your hospital bag.  Prepare the baby's nursery.  Continue to go to all your prenatal visits as directed by your caregiver. SEEK MEDICAL CARE IF:  You are unsure if you are in labor or if your water has broken.  You have dizziness.  You have mild pelvic cramps, pelvic pressure, or nagging pain in your abdominal area.  You have persistent nausea, vomiting, or diarrhea.  You have a bad smelling vaginal discharge.  You have pain with  urination. SEEK IMMEDIATE MEDICAL CARE IF:   You have a fever.  You are leaking fluid from your vagina.  You have spotting or bleeding from your vagina.  You have severe abdominal cramping or pain.  You have rapid weight loss or gain.  You have shortness of breath with chest pain.  You notice sudden or extreme swelling of your face, hands, ankles, feet, or legs.  You have not felt your baby move in over an hour.  You have severe headaches that do not go away with medicine.  You have vision changes. Document Released: 12/24/2000 Document Revised: 01/04/2013 Document Reviewed: 03/02/2012 [facility] Patient Information 2015 [place], [place]. This information is not intended to replace advice given to you by your health care provider. Make sure you discuss any questions you have with your health care provider.  \

## 2015-03-06 NOTE — Progress Notes (Signed)
Pt states that she wants to discuss her sugars and some symptoms she is having.

## 2015-03-06 NOTE — Progress Notes (Signed)
[id number] [redacted]w[redacted]d Estimated Date of Delivery: 04/17/15  Blood pressure 120/76, pulse 104, weight 147 lb 8 oz (66.906 kg).   BP weight and urine results all reviewed and noted.  Please refer to the obstetrical flow sheet for the fundal height and fetal heart rate documentation:  Patient reports good fetal movement, denies any bleeding and no rupture of membranes symptoms or regular contractions. Patient c/o anxiety. Had been on xanax for years, quit with pregnancy.  Having some trouble connecting with baby.  2 recent stillbirths in her family--thinks that may be why. May want to try buspar. Has used Gabeflo (amino acid spray) in past, worked well.  Of course, there are no pregnancy studies on it.  All questions were answered.  Orders Placed This Encounter  Procedures  . POCT urinalysis dipstick    Plan:  Continued routine obstetrical care, referal to [facility] made.   Return in about 2 weeks (around 03/20/2015) for LROB.

## 2015-03-20 ENCOUNTER — Ambulatory Visit (INDEPENDENT_AMBULATORY_CARE_PROVIDER_SITE_OTHER): Payer: Medicaid Other | Admitting: Women's Health

## 2015-03-20 VITALS — BP 112/72 | HR 102 | Wt 152.5 lb

## 2015-03-20 DIAGNOSIS — Z3483 Encounter for supervision of other normal pregnancy, third trimester: Secondary | ICD-10-CM

## 2015-03-20 DIAGNOSIS — Z3493 Encounter for supervision of normal pregnancy, unspecified, third trimester: Secondary | ICD-10-CM

## 2015-03-20 DIAGNOSIS — Z3A36 36 weeks gestation of pregnancy: Secondary | ICD-10-CM

## 2015-03-20 DIAGNOSIS — Z1389 Encounter for screening for other disorder: Secondary | ICD-10-CM

## 2015-03-20 DIAGNOSIS — Z331 Pregnant state, incidental: Secondary | ICD-10-CM

## 2015-03-20 LAB — POCT URINALYSIS DIPSTICK
Blood, UA: NEGATIVE
Glucose, UA: NEGATIVE
Ketones, UA: NEGATIVE
LEUKOCYTES UA: NEGATIVE
NITRITE UA: NEGATIVE
PROTEIN UA: NEGATIVE

## 2015-03-20 NOTE — Progress Notes (Signed)
Low-risk OB appointment G3P1011 6255w0d Estimated Date of Delivery: 04/17/15 BP 112/72 mmHg  Pulse 102  Wt 152 lb 8 oz (69.174 kg)  LMP  (LMP Unknown)  BP, weight, and urine reviewed.  Refer to obstetrical flow sheet for FH & FHR.  Reports good fm.  Denies regular uc's, lof, vb, or uti s/s. Craves smell of soap and moth balls, has urge to eat, but states she knows better and has not.  Reviewed ptl s/s, fkc. Plan:  Continue routine obstetrical care  F/U in 1wk for OB appointment and gbs

## 2015-03-20 NOTE — Patient Instructions (Signed)
Call the office ([phone]) or go to [facility] if:  You begin to have strong, frequent contractions  Your water breaks.  Sometimes it is a big gush of fluid, sometimes it is just a trickle that keeps getting your panties wet or running down your legs  You have vaginal bleeding.  It is normal to have a small amount of spotting if your cervix was checked.   You don't feel your baby moving like normal.  If you don't, get you something to eat and drink and lay down and focus on feeling your baby move.  You should feel at least 10 movements in 2 hours.  If you don't, you should call the office or go to [facility].    Preterm Labor Information Preterm labor is when labor starts at less than 37 weeks of pregnancy. The normal length of a pregnancy is 39 to 41 weeks. CAUSES Often, there is no identifiable underlying cause as to why a woman goes into preterm labor. One of the most common known causes of preterm labor is infection. Infections of the uterus, cervix, vagina, amniotic sac, bladder, kidney, or even the lungs (pneumonia) can cause labor to start. Other suspected causes of preterm labor include:   Urogenital infections, such as yeast infections and bacterial vaginosis.   Uterine abnormalities (uterine shape, uterine septum, fibroids, or bleeding from the placenta).   A cervix that has been operated on (it may fail to stay closed).   Malformations in the fetus.   Multiple gestations (twins, triplets, and so on).   Breakage of the amniotic sac.  RISK FACTORS  Having a previous history of preterm labor.   Having premature rupture of membranes (PROM).   Having a placenta that covers the opening of the cervix (placenta previa).   Having a placenta that separates from the uterus (placental abruption).   Having a cervix that is too weak to hold the fetus in the uterus (incompetent cervix).   Having too much fluid in the amniotic sac (polyhydramnios).   Taking  illegal drugs or smoking while pregnant.   Not gaining enough weight while pregnant.   Being younger than 18 and older than 35 years old.   Having a low socioeconomic status.   Being African American. SYMPTOMS Signs and symptoms of preterm labor include:   Menstrual-like cramps, abdominal pain, or back pain.  Uterine contractions that are regular, as frequent as six in an hour, regardless of their intensity (may be mild or painful).  Contractions that start on the top of the uterus and spread down to the lower abdomen and back.   A sense of increased pelvic pressure.   A watery or bloody mucus discharge that comes from the vagina.  TREATMENT Depending on the length of the pregnancy and other circumstances, your health care provider may suggest bed rest. If necessary, there are medicines that can be given to stop contractions and to mature the fetal lungs. If labor happens before 34 weeks of pregnancy, a prolonged hospital stay may be recommended. Treatment depends on the condition of both you and the fetus.  WHAT SHOULD YOU DO IF YOU THINK YOU ARE IN PRETERM LABOR? Call your health care provider right away. You will need to go to the hospital to get checked immediately. HOW CAN YOU PREVENT PRETERM LABOR IN FUTURE PREGNANCIES? You should:   Stop smoking if you smoke.  Maintain healthy weight gain and avoid chemicals and drugs that are not necessary.  Be watchful for   any type of infection.  Inform your health care provider if you have a known history of preterm labor.   This information is not intended to replace advice given to you by your health care provider. Make sure you discuss any questions you have with your health care provider.   Document Released: 03/22/2003 Document Revised: 09/01/2012 Document Reviewed: 02/02/2012 Elsevier Interactive Patient Education 2016 Elsevier Inc.  

## 2015-03-26 ENCOUNTER — Ambulatory Visit (INDEPENDENT_AMBULATORY_CARE_PROVIDER_SITE_OTHER): Payer: Medicaid Other | Admitting: Obstetrics and Gynecology

## 2015-03-26 ENCOUNTER — Encounter: Payer: Self-pay | Admitting: Obstetrics and Gynecology

## 2015-03-26 VITALS — BP 100/70 | HR 139 | Wt 152.0 lb

## 2015-03-26 DIAGNOSIS — O09293 Supervision of pregnancy with other poor reproductive or obstetric history, third trimester: Secondary | ICD-10-CM

## 2015-03-26 DIAGNOSIS — Z369 Encounter for antenatal screening, unspecified: Secondary | ICD-10-CM

## 2015-03-26 DIAGNOSIS — Z3493 Encounter for supervision of normal pregnancy, unspecified, third trimester: Secondary | ICD-10-CM

## 2015-03-26 DIAGNOSIS — Z1389 Encounter for screening for other disorder: Secondary | ICD-10-CM

## 2015-03-26 DIAGNOSIS — Z8632 Personal history of gestational diabetes: Secondary | ICD-10-CM

## 2015-03-26 DIAGNOSIS — Z331 Pregnant state, incidental: Secondary | ICD-10-CM

## 2015-03-26 LAB — POCT URINALYSIS DIPSTICK
Blood, UA: NEGATIVE
Glucose, UA: NEGATIVE
Ketones, UA: NEGATIVE
Leukocytes, UA: NEGATIVE
Nitrite, UA: NEGATIVE
Protein, UA: NEGATIVE

## 2015-03-26 LAB — OB RESULTS CONSOLE GBS: STREP GROUP B AG: NEGATIVE

## 2015-03-26 NOTE — Progress Notes (Signed)
Patient ID: Kim Carrillo, female   DOB: 1981/01/08, 35 y.o.   MRN: [id number]  [id number] 3542w6d Estimated Date of Delivery: 04/17/15  Blood pressure 100/70, pulse 139, weight 152 lb (68.947 kg).   refer to the ob flow sheet for FH and FHR, also BP, Wt, Urine results:notable for negative  Patient reports  + good fetal movement, denies any bleeding and no rupture of membranes symptoms or regular contractions. Patient complaints: None. Patient states she desires tubal ligation.   FHR: 132 bpm FH: 36 cm  Questions were answered. Assessment: LROB G3P1011 @ 7542w6d    GBS collected.    Pt desires tubal ligation. Risks and benefits discussed in lengthy conversation, and crib death discussed. At end of discussion, pt had opportunity to ask questions and has no further questions at this time.   Plan:  Continued routine obstetrical care,   Results by MyChart.  F/u in 1 weeks for pnx care   By signing my name below, I, Ronney LionSuzanne Le, attest that this documentation has been prepared under the direction and in the presence of Tilda BurrowJohn Roshana Shuffield V, MD. Electronically Signed: Ronney LionSuzanne Le, ED Scribe. 03/26/2015. 12:36 PM.  I personally performed the services described in this documentation, which was SCRIBED in my presence. The recorded information has been reviewed and considered accurate. It has been edited as necessary during review. Tilda BurrowFERGUSON,Aveena Bari V, MD

## 2015-03-26 NOTE — Progress Notes (Signed)
Pt denies any problems or concerns at this time.  

## 2015-03-27 LAB — GC/CHLAMYDIA PROBE AMP
Chlamydia trachomatis, NAA: NEGATIVE
Neisseria gonorrhoeae by PCR: NEGATIVE

## 2015-03-28 LAB — STREP GP B NAA: Strep Gp B NAA: NEGATIVE

## 2015-04-02 ENCOUNTER — Encounter: Payer: Medicaid Other | Admitting: Women's Health

## 2015-04-04 ENCOUNTER — Encounter: Payer: Medicaid Other | Admitting: Women's Health

## 2015-04-05 ENCOUNTER — Ambulatory Visit (INDEPENDENT_AMBULATORY_CARE_PROVIDER_SITE_OTHER): Payer: Medicaid Other | Admitting: Obstetrics & Gynecology

## 2015-04-05 ENCOUNTER — Encounter: Payer: Self-pay | Admitting: Obstetrics & Gynecology

## 2015-04-05 VITALS — BP 110/80 | HR 80 | Wt 152.0 lb

## 2015-04-05 DIAGNOSIS — Z331 Pregnant state, incidental: Secondary | ICD-10-CM

## 2015-04-05 DIAGNOSIS — Z3493 Encounter for supervision of normal pregnancy, unspecified, third trimester: Secondary | ICD-10-CM

## 2015-04-05 DIAGNOSIS — Z1389 Encounter for screening for other disorder: Secondary | ICD-10-CM

## 2015-04-05 LAB — POCT URINALYSIS DIPSTICK
GLUCOSE UA: NEGATIVE
KETONES UA: NEGATIVE
LEUKOCYTES UA: NEGATIVE
NITRITE UA: NEGATIVE
PROTEIN UA: NEGATIVE
RBC UA: NEGATIVE

## 2015-04-05 NOTE — Progress Notes (Signed)
[id number] 5473w2d Estimated Date of Delivery: 04/17/15  Blood pressure 110/80, pulse 80, weight 152 lb (68.947 kg).   BP weight and urine results all reviewed and noted.  Please refer to the obstetrical flow sheet for the fundal height and fetal heart rate documentation:  Patient reports good fetal movement, denies any bleeding and no rupture of membranes symptoms or regular contractions. Patient is without complaints. All questions were answered.  Orders Placed This Encounter  Procedures  . POCT urinalysis dipstick    Plan:  Continued routine obstetrical care,   Return in about 1 week (around 04/12/2015) for LROB.

## 2015-04-07 ENCOUNTER — Inpatient Hospital Stay (HOSPITAL_COMMUNITY)
Admission: AD | Admit: 2015-04-07 | Discharge: 2015-04-08 | Disposition: A | Discharge status: Home / Self Care | Payer: Medicaid Other | Source: Ambulatory Visit | Attending: Obstetrics & Gynecology | Admitting: Obstetrics & Gynecology

## 2015-04-07 DIAGNOSIS — Z3493 Encounter for supervision of normal pregnancy, unspecified, third trimester: Secondary | ICD-10-CM

## 2015-04-07 NOTE — MAU Note (Signed)
I've been having irreg ctxs for awhile but stronger and closer tonight since 2000. Denies LOF or bleeding

## 2015-04-08 ENCOUNTER — Encounter (HOSPITAL_COMMUNITY): Payer: Self-pay

## 2015-04-08 DIAGNOSIS — Z3493 Encounter for supervision of normal pregnancy, unspecified, third trimester: Secondary | ICD-10-CM | POA: Diagnosis present

## 2015-04-08 LAB — URINALYSIS, ROUTINE W REFLEX MICROSCOPIC
Bilirubin Urine: NEGATIVE
Glucose, UA: NEGATIVE mg/dL
Hgb urine dipstick: NEGATIVE
Ketones, ur: NEGATIVE mg/dL
LEUKOCYTES UA: NEGATIVE
NITRITE: NEGATIVE
PROTEIN: NEGATIVE mg/dL
Specific Gravity, Urine: 1.01 (ref 1.005–1.030)
pH: 6 (ref 5.0–8.0)

## 2015-04-08 NOTE — Discharge Instructions (Signed)
Braxton Hicks Contractions °Contractions of the uterus can occur throughout pregnancy. Contractions are not always a sign that you are in labor.  °WHAT ARE BRAXTON HICKS CONTRACTIONS?  °Contractions that occur before labor are called Braxton Hicks contractions, or false labor. Toward the end of pregnancy (32-34 weeks), these contractions can develop more often and may become more forceful. This is not true labor because these contractions do not result in opening (dilatation) and thinning of the cervix. They are sometimes difficult to tell apart from true labor because these contractions can be forceful and people have different pain tolerances. You should not feel embarrassed if you go to the hospital with false labor. Sometimes, the only way to tell if you are in true labor is for your health care provider to look for changes in the cervix. °If there are no prenatal problems or other health problems associated with the pregnancy, it is completely safe to be sent home with false labor and await the onset of true labor. °HOW CAN YOU TELL THE DIFFERENCE BETWEEN TRUE AND FALSE LABOR? °False Labor °· The contractions of false labor are usually shorter and not as hard as those of true labor.   °· The contractions are usually irregular.   °· The contractions are often felt in the front of the lower abdomen and in the groin.   °· The contractions may go away when you walk around or change positions while lying down.   °· The contractions get weaker and are shorter lasting as time goes on.   °· The contractions do not usually become progressively stronger, regular, and closer together as with true labor.   °True Labor °· Contractions in true labor last 30-70 seconds, become very regular, usually become more intense, and increase in frequency.   °· The contractions do not go away with walking.   °· The discomfort is usually felt in the top of the uterus and spreads to the lower abdomen and low back.   °· True labor can be  determined by your health care provider with an exam. This will show that the cervix is dilating and getting thinner.   °WHAT TO REMEMBER °· Keep up with your usual exercises and follow other instructions given by your health care provider.   °· Take medicines as directed by your health care provider.   °· Keep your regular prenatal appointments.   °· Eat and drink lightly if you think you are going into labor.   °· If Braxton Hicks contractions are making you uncomfortable:   °¨ Change your position from lying down or resting to walking, or from walking to resting.   °¨ Sit and rest in a tub of warm water.   °¨ Drink 2-3 glasses of water. Dehydration may cause these contractions.   °¨ Do slow and deep breathing several times an hour.   °WHEN SHOULD I SEEK IMMEDIATE MEDICAL CARE? °Seek immediate medical care if: °· Your contractions become stronger, more regular, and closer together.   °· You have fluid leaking or gushing from your vagina.   °· You have a fever.   °· You pass blood-tinged mucus.   °· You have vaginal bleeding.   °· You have continuous abdominal pain.   °· You have low back pain that you never had before.   °· You feel your baby's head pushing down and causing pelvic pressure.   °· Your baby is not moving as much as it used to.   °  °This information is not intended to replace advice given to you by your health care provider. Make sure you discuss any questions you have with your health care   provider.   Document Released: 12/30/2004 Document Revised: 01/04/2013 Document Reviewed: 10/11/2012 Elsevier Interactive Patient Education [employer]2016 Elsevier Inc. Third Trimester of Pregnancy The third trimester is from week 29 through week 42, months 7 through 9. This trimester is when your unborn baby (fetus) is growing very fast. At the end of the ninth month, the unborn baby is about 20 inches in length. It weighs about 6-10 pounds.  HOME CARE   Avoid all smoking, herbs, and alcohol. Avoid drugs not  approved by your doctor.  Do not use any tobacco products, including cigarettes, chewing tobacco, and electronic cigarettes. If you need help quitting, ask your doctor. You may get counseling or other support to help you quit.  Only take medicine as told by your doctor. Some medicines are safe and some are not during pregnancy.  Exercise only as told by your doctor. Stop exercising if you start having cramps.  Eat regular, healthy meals.  Wear a good support bra if your breasts are tender.  Do not use hot tubs, steam rooms, or saunas.  Wear your seat belt when driving.  Avoid raw meat, uncooked cheese, and liter boxes and soil used by cats.  Take your prenatal vitamins.  Take 1500-2000 milligrams of calcium daily starting at the 20th week of pregnancy until you deliver your baby.  Try taking medicine that helps you poop (stool softener) as needed, and if your doctor approves. Eat more fiber by eating fresh fruit, vegetables, and whole grains. Drink enough fluids to keep your pee (urine) clear or pale yellow.  Take warm water baths (sitz baths) to soothe pain or discomfort caused by hemorrhoids. Use hemorrhoid cream if your doctor approves.  If you have puffy, bulging veins (varicose veins), wear support hose. Raise (elevate) your feet for 15 minutes, 3-4 times a day. Limit salt in your diet.  Avoid heavy lifting, wear low heels, and sit up straight.  Rest with your legs raised if you have leg cramps or low back pain.  Visit your dentist if you have not gone during your pregnancy. Use a soft toothbrush to brush your teeth. Be gentle when you floss.  You can have sex (intercourse) unless your doctor tells you not to.  Do not travel far distances unless you must. Only do so with your doctor's approval.  Take prenatal classes.  Practice driving to the hospital.  Pack your hospital bag.  Prepare the baby's room.  Go to your doctor visits. GET HELP IF:  You are not sure if  you are in labor or if your water has broken.  You are dizzy.  You have mild cramps or pressure in your lower belly (abdominal).  You have a nagging pain in your belly area.  You continue to feel sick to your stomach (nauseous), throw up (vomit), or have watery poop (diarrhea).  You have bad smelling fluid coming from your vagina.  You have pain with peeing (urination). GET HELP RIGHT AWAY IF:   You have a fever.  You are leaking fluid from your vagina.  You are spotting or bleeding from your vagina.  You have severe belly cramping or pain.  You lose or gain weight rapidly.  You have trouble catching your breath and have chest pain.  You notice sudden or extreme puffiness (swelling) of your face, hands, ankles, feet, or legs.  You have not felt the baby move in over an hour.  You have severe headaches that do not go away with medicine.  You  have vision changes. °  °This information is not intended to replace advice given to you by your health care provider. Make sure you discuss any questions you have with your health care provider. °  °Document Released: 03/26/2009 Document Revised: 01/20/2014 Document Reviewed: 03/02/2012 °Elsevier Interactive Patient Education ©2016 Elsevier Inc. ° °

## 2015-04-08 NOTE — Progress Notes (Signed)
Went in to recheck pt's cervix and pt said she prefers not to have cervix rechecked because she is no longer contracting like she was home and is not that uncomfortable now.  Notified Dr [place]- d/c orders received.

## 2015-04-12 ENCOUNTER — Encounter: Payer: Self-pay | Admitting: Advanced Practice Midwife

## 2015-04-12 ENCOUNTER — Ambulatory Visit (INDEPENDENT_AMBULATORY_CARE_PROVIDER_SITE_OTHER): Payer: Medicaid Other | Admitting: Advanced Practice Midwife

## 2015-04-12 VITALS — BP 128/80 | HR 100 | Wt 153.5 lb

## 2015-04-12 DIAGNOSIS — Z3A40 40 weeks gestation of pregnancy: Secondary | ICD-10-CM

## 2015-04-12 DIAGNOSIS — Z3483 Encounter for supervision of other normal pregnancy, third trimester: Secondary | ICD-10-CM | POA: Diagnosis not present

## 2015-04-12 DIAGNOSIS — Z3493 Encounter for supervision of normal pregnancy, unspecified, third trimester: Secondary | ICD-10-CM

## 2015-04-12 DIAGNOSIS — Z1389 Encounter for screening for other disorder: Secondary | ICD-10-CM

## 2015-04-12 DIAGNOSIS — Z331 Pregnant state, incidental: Secondary | ICD-10-CM

## 2015-04-12 LAB — POCT URINALYSIS DIPSTICK
Blood, UA: NEGATIVE
GLUCOSE UA: NEGATIVE
GLUCOSE UA: NEGATIVE
KETONES UA: NEGATIVE
Nitrite, UA: NEGATIVE
Protein, UA: NEGATIVE
Protein, UA: NEGATIVE

## 2015-04-12 NOTE — Progress Notes (Addendum)
[id number] 7629w2d Estimated Date of Delivery: 04/17/15  Blood pressure 128/80, pulse 100, weight 153 lb 8 oz (69.627 kg).   BP weight and urine results all reviewed and noted.  Please refer to the obstetrical flow sheet for the fundal height and fetal heart rate documentation:  Patient reports good fetal movement, denies any bleeding and no rupture of membranes symptoms or regular contractions. Patient is without complaints other than white dc wi/o odor.  SSE:  Normal appearing dc All questions were answered.  Orders Placed This Encounter  Procedures  . POCT Urinalysis Dipstick    Plan:  Continued routine obstetrical care,   Return in about 1 week (around 04/19/2015) for LROB.

## 2015-04-19 ENCOUNTER — Encounter: Payer: Self-pay | Admitting: Obstetrics & Gynecology

## 2015-04-19 ENCOUNTER — Encounter (HOSPITAL_COMMUNITY): Payer: Self-pay | Admitting: *Deleted

## 2015-04-19 ENCOUNTER — Telehealth (HOSPITAL_COMMUNITY): Payer: Self-pay | Admitting: *Deleted

## 2015-04-19 ENCOUNTER — Ambulatory Visit (INDEPENDENT_AMBULATORY_CARE_PROVIDER_SITE_OTHER): Payer: Medicaid Other | Admitting: Obstetrics & Gynecology

## 2015-04-19 VITALS — BP 100/80 | HR 96 | Wt 155.4 lb

## 2015-04-19 DIAGNOSIS — Z331 Pregnant state, incidental: Secondary | ICD-10-CM

## 2015-04-19 DIAGNOSIS — Z3A41 41 weeks gestation of pregnancy: Secondary | ICD-10-CM

## 2015-04-19 DIAGNOSIS — Z1389 Encounter for screening for other disorder: Secondary | ICD-10-CM

## 2015-04-19 DIAGNOSIS — O480 Post-term pregnancy: Secondary | ICD-10-CM

## 2015-04-19 DIAGNOSIS — Z3493 Encounter for supervision of normal pregnancy, unspecified, third trimester: Secondary | ICD-10-CM

## 2015-04-19 LAB — POCT URINALYSIS DIPSTICK
Glucose, UA: NEGATIVE
KETONES UA: NEGATIVE
Leukocytes, UA: NEGATIVE
NITRITE UA: NEGATIVE
Protein, UA: NEGATIVE
RBC UA: NEGATIVE

## 2015-04-19 NOTE — Telephone Encounter (Signed)
Preadmission screen  

## 2015-04-19 NOTE — Progress Notes (Signed)
[id number] 1754w2d Estimated Date of Delivery: 04/17/15  Blood pressure 100/80, pulse 96, weight 155 lb 6.4 oz (70.489 kg).   BP weight and urine results all reviewed and noted.  Please refer to the obstetrical flow sheet for the fundal height and fetal heart rate documentation:  Patient reports good fetal movement, denies any bleeding and no rupture of membranes symptoms or regular contractions. Patient is without complaints. All questions were answered.  Orders Placed This Encounter  Procedures  . POCT urinalysis dipstick    Plan:  Continued routine obstetrical care, membranes stripped, suboptimally cervix is still thick Induction scheduled for 04/24/2015  No Follow-up on file.

## 2015-04-21 ENCOUNTER — Inpatient Hospital Stay (HOSPITAL_COMMUNITY)
Admission: AD | Admit: 2015-04-21 | Discharge: 2015-04-23 | DRG: 775 | Disposition: A | Discharge status: Home / Self Care | Payer: Medicaid Other | Source: Ambulatory Visit | Attending: Obstetrics & Gynecology | Admitting: Obstetrics & Gynecology

## 2015-04-21 ENCOUNTER — Encounter (HOSPITAL_COMMUNITY): Payer: Self-pay | Admitting: Certified Nurse Midwife

## 2015-04-21 ENCOUNTER — Inpatient Hospital Stay (HOSPITAL_COMMUNITY): Payer: Medicaid Other | Admitting: Anesthesiology

## 2015-04-21 DIAGNOSIS — F418 Other specified anxiety disorders: Secondary | ICD-10-CM | POA: Diagnosis present

## 2015-04-21 DIAGNOSIS — Z8249 Family history of ischemic heart disease and other diseases of the circulatory system: Secondary | ICD-10-CM

## 2015-04-21 DIAGNOSIS — O99344 Other mental disorders complicating childbirth: Secondary | ICD-10-CM | POA: Diagnosis present

## 2015-04-21 DIAGNOSIS — Z3A40 40 weeks gestation of pregnancy: Secondary | ICD-10-CM | POA: Diagnosis not present

## 2015-04-21 DIAGNOSIS — Z8632 Personal history of gestational diabetes: Secondary | ICD-10-CM

## 2015-04-21 DIAGNOSIS — O411230 Chorioamnionitis, third trimester, not applicable or unspecified: Secondary | ICD-10-CM | POA: Diagnosis present

## 2015-04-21 DIAGNOSIS — O134 Gestational [pregnancy-induced] hypertension without significant proteinuria, complicating childbirth: Principal | ICD-10-CM | POA: Diagnosis present

## 2015-04-21 DIAGNOSIS — O169 Unspecified maternal hypertension, unspecified trimester: Secondary | ICD-10-CM | POA: Diagnosis present

## 2015-04-21 DIAGNOSIS — O163 Unspecified maternal hypertension, third trimester: Secondary | ICD-10-CM

## 2015-04-21 DIAGNOSIS — Z87891 Personal history of nicotine dependence: Secondary | ICD-10-CM

## 2015-04-21 DIAGNOSIS — Z3493 Encounter for supervision of normal pregnancy, unspecified, third trimester: Secondary | ICD-10-CM

## 2015-04-21 LAB — COMPREHENSIVE METABOLIC PANEL
ALT: 12 U/L — AB (ref 14–54)
AST: 20 U/L (ref 15–41)
Albumin: 2.5 g/dL — ABNORMAL LOW (ref 3.5–5.0)
Alkaline Phosphatase: 195 U/L — ABNORMAL HIGH (ref 38–126)
Anion gap: 6 (ref 5–15)
BILIRUBIN TOTAL: 0.3 mg/dL (ref 0.3–1.2)
BUN: 5 mg/dL — AB (ref 6–20)
CALCIUM: 8.3 mg/dL — AB (ref 8.9–10.3)
CO2: 22 mmol/L (ref 22–32)
CREATININE: 0.5 mg/dL (ref 0.44–1.00)
Chloride: 109 mmol/L (ref 101–111)
GFR calc Af Amer: >60 mL/min (ref >60)
Glucose, Bld: 102 mg/dL — ABNORMAL HIGH (ref 65–99)
Potassium: 4 mmol/L (ref 3.5–5.1)
Sodium: 137 mmol/L (ref 135–145)
TOTAL PROTEIN: 5.8 g/dL — AB (ref 6.5–8.1)

## 2015-04-21 LAB — URINALYSIS, ROUTINE W REFLEX MICROSCOPIC
Bilirubin Urine: NEGATIVE
GLUCOSE, UA: NEGATIVE mg/dL
Hgb urine dipstick: NEGATIVE
KETONES UR: NEGATIVE mg/dL
NITRITE: NEGATIVE
PROTEIN: NEGATIVE mg/dL
Specific Gravity, Urine: 1.015 (ref 1.005–1.030)
pH: 6.5 (ref 5.0–8.0)

## 2015-04-21 LAB — CBC
HCT: 32 % — ABNORMAL LOW (ref 36.0–46.0)
Hemoglobin: 10.3 g/dL — ABNORMAL LOW (ref 12.0–15.0)
MCH: 27.7 pg (ref 26.0–34.0)
MCHC: 32.2 g/dL (ref 30.0–36.0)
MCV: 86 fL (ref 78.0–100.0)
PLATELETS: 279 10*3/uL (ref 150–400)
RBC: 3.72 MIL/uL — ABNORMAL LOW (ref 3.87–5.11)
RDW: 14.7 % (ref 11.5–15.5)
WBC: 9 10*3/uL (ref 4.0–10.5)

## 2015-04-21 LAB — ABO/RH: ABO/RH(D): O POS

## 2015-04-21 LAB — PROTEIN / CREATININE RATIO, URINE
Creatinine, Urine: 67 mg/dL
Protein Creatinine Ratio: 0.18 mg/mg{Cre} — ABNORMAL HIGH (ref 0.00–0.15)
Total Protein, Urine: 12 mg/dL

## 2015-04-21 LAB — TYPE AND SCREEN
ABO/RH(D): O POS
ANTIBODY SCREEN: NEGATIVE

## 2015-04-21 LAB — URINE MICROSCOPIC-ADD ON

## 2015-04-21 LAB — AMNISURE RUPTURE OF MEMBRANE (ROM) NOT AT ARMC: Amnisure ROM: NEGATIVE

## 2015-04-21 MED ORDER — LACTATED RINGERS IV SOLN
500.0000 mL | INTRAVENOUS | Status: DC | PRN
  Administered 2015-04-21 (×2): 500 mL via INTRAVENOUS

## 2015-04-21 MED ORDER — LACTATED RINGERS IV SOLN
INTRAVENOUS | Status: DC
  Administered 2015-04-21 (×2): via INTRAVENOUS

## 2015-04-21 MED ORDER — LACTATED RINGERS IV SOLN
500.0000 mL | Freq: Once | INTRAVENOUS | Status: AC
  Administered 2015-04-21: 500 mL via INTRAVENOUS

## 2015-04-21 MED ORDER — OXYCODONE-ACETAMINOPHEN 5-325 MG PO TABS: 1.0000 | ORAL_TABLET | ORAL | Status: DC | PRN

## 2015-04-21 MED ORDER — GENTAMICIN SULFATE 40 MG/ML IJ SOLN
140.0000 mg | Freq: Three times a day (TID) | INTRAVENOUS | Status: DC
  Filled 2015-04-21: qty 3.5

## 2015-04-21 MED ORDER — PHENYLEPHRINE 40 MCG/ML (10ML) SYRINGE FOR IV PUSH (FOR BLOOD PRESSURE SUPPORT): 80.0000 ug | PREFILLED_SYRINGE | INTRAVENOUS | Status: DC | PRN

## 2015-04-21 MED ORDER — MISOPROSTOL 25 MCG QUARTER TABLET
25.0000 ug | ORAL_TABLET | ORAL | Status: DC | PRN
  Administered 2015-04-21 (×2): 25 ug via VAGINAL
  Filled 2015-04-21 (×2): qty 0.25
  Filled 2015-04-21: qty 1

## 2015-04-21 MED ORDER — ONDANSETRON HCL 4 MG/2ML IJ SOLN
4.0000 mg | Freq: Four times a day (QID) | INTRAMUSCULAR | Status: DC | PRN
  Administered 2015-04-21: 4 mg via INTRAVENOUS
  Filled 2015-04-21: qty 2

## 2015-04-21 MED ORDER — HYDRALAZINE HCL 20 MG/ML IJ SOLN: 10.0000 mg | Freq: Once | INTRAMUSCULAR | Status: DC | PRN

## 2015-04-21 MED ORDER — FENTANYL CITRATE (PF) 100 MCG/2ML IJ SOLN
100.0000 ug | INTRAMUSCULAR | Status: DC | PRN
  Administered 2015-04-21 (×2): 100 ug via INTRAVENOUS
  Filled 2015-04-21: qty 2

## 2015-04-21 MED ORDER — ACETAMINOPHEN 325 MG PO TABS
650.0000 mg | ORAL_TABLET | ORAL | Status: DC | PRN
  Administered 2015-04-21: 650 mg via ORAL
  Filled 2015-04-21: qty 2

## 2015-04-21 MED ORDER — GENTAMICIN SULFATE 40 MG/ML IJ SOLN: 1.5000 mg/kg | Freq: Three times a day (TID) | INTRAVENOUS | Status: DC

## 2015-04-21 MED ORDER — LACTATED RINGERS IV SOLN: 500.0000 mL | Freq: Once | INTRAVENOUS | Status: DC

## 2015-04-21 MED ORDER — CITRIC ACID-SODIUM CITRATE 334-500 MG/5ML PO SOLN
30.0000 mL | ORAL | Status: DC | PRN
  Filled 2015-04-21 (×2): qty 15

## 2015-04-21 MED ORDER — EPHEDRINE 5 MG/ML INJ: 10.0000 mg | INTRAVENOUS | Status: DC | PRN

## 2015-04-21 MED ORDER — LABETALOL HCL 5 MG/ML IV SOLN: 20.0000 mg | INTRAVENOUS | Status: DC | PRN

## 2015-04-21 MED ORDER — LIDOCAINE HCL (PF) 1 % IJ SOLN
30.0000 mL | INTRAMUSCULAR | Status: DC | PRN
  Filled 2015-04-21: qty 30

## 2015-04-21 MED ORDER — DIPHENHYDRAMINE HCL 50 MG/ML IJ SOLN
12.5000 mg | INTRAMUSCULAR | Status: DC | PRN
  Administered 2015-04-21: 12.5 mg via INTRAVENOUS
  Filled 2015-04-21: qty 1

## 2015-04-21 MED ORDER — OXYTOCIN BOLUS FROM INFUSION
500.0000 mL | INTRAVENOUS | Status: DC
  Administered 2015-04-21: 500 mL via INTRAVENOUS

## 2015-04-21 MED ORDER — PHENYLEPHRINE 40 MCG/ML (10ML) SYRINGE FOR IV PUSH (FOR BLOOD PRESSURE SUPPORT)
80.0000 ug | PREFILLED_SYRINGE | INTRAVENOUS | Status: DC | PRN
  Filled 2015-04-21: qty 20
  Filled 2015-04-21: qty 2

## 2015-04-21 MED ORDER — PHENYLEPHRINE 40 MCG/ML (10ML) SYRINGE FOR IV PUSH (FOR BLOOD PRESSURE SUPPORT)
80.0000 ug | PREFILLED_SYRINGE | INTRAVENOUS | Status: DC | PRN
  Filled 2015-04-21: qty 2

## 2015-04-21 MED ORDER — EPHEDRINE 5 MG/ML INJ
10.0000 mg | INTRAVENOUS | Status: DC | PRN
  Filled 2015-04-21: qty 2

## 2015-04-21 MED ORDER — SODIUM CHLORIDE 0.9 % IV SOLN
2.0000 g | Freq: Four times a day (QID) | INTRAVENOUS | Status: DC
  Administered 2015-04-21: 2 g via INTRAVENOUS
  Filled 2015-04-21 (×3): qty 2000

## 2015-04-21 MED ORDER — OXYTOCIN 10 UNIT/ML IJ SOLN
2.5000 [IU]/h | INTRAVENOUS | Status: DC
  Filled 2015-04-21: qty 4

## 2015-04-21 MED ORDER — FENTANYL 2.5 MCG/ML BUPIVACAINE 1/10 % EPIDURAL INFUSION (WH - ANES)
14.0000 mL/h | INTRAMUSCULAR | Status: DC | PRN
  Administered 2015-04-21 (×3): 14 mL/h via EPIDURAL
  Filled 2015-04-21 (×2): qty 125

## 2015-04-21 MED ORDER — LACTATED RINGERS IV SOLN: INTRAVENOUS | Status: DC

## 2015-04-21 MED ORDER — FENTANYL CITRATE (PF) 100 MCG/2ML IJ SOLN
INTRAMUSCULAR | Status: AC
  Filled 2015-04-21: qty 2

## 2015-04-21 MED ORDER — CITRIC ACID-SODIUM CITRATE 334-500 MG/5ML PO SOLN
30.0000 mL | Freq: Four times a day (QID) | ORAL | Status: DC | PRN
  Administered 2015-04-21: 30 mL via ORAL

## 2015-04-21 MED ORDER — TERBUTALINE SULFATE 1 MG/ML IJ SOLN
0.2500 mg | Freq: Once | INTRAMUSCULAR | Status: AC | PRN
  Administered 2015-04-21: 0.25 mg via SUBCUTANEOUS
  Filled 2015-04-21: qty 1

## 2015-04-21 MED ORDER — LIDOCAINE HCL (PF) 1 % IJ SOLN
INTRAMUSCULAR | Status: DC | PRN
  Administered 2015-04-21: 5 mL
  Administered 2015-04-21: 2 mL via EPIDURAL
  Administered 2015-04-21: 3 mL

## 2015-04-21 MED ORDER — OXYCODONE-ACETAMINOPHEN 5-325 MG PO TABS: 2.0000 | ORAL_TABLET | ORAL | Status: DC | PRN

## 2015-04-21 MED ORDER — DEXTROSE 5 % IV SOLN
150.0000 mg | Freq: Once | INTRAVENOUS | Status: AC
  Administered 2015-04-21: 150 mg via INTRAVENOUS
  Filled 2015-04-21: qty 3.75

## 2015-04-21 NOTE — Progress Notes (Addendum)
LABOR PROGRESS NOTE  Kim Carrillo is a 35 y.o. G3P1011 at 5910w4d  admitted for IOL secondary to gHTN  Subjective: Epidural in place.  Still feeling contractions.  Called to room for repetitive variables.  Objective: BP 125/72 mmHg  Pulse 127  Temp(Src) 99.3 F (37.4 C) (Oral)  Resp 20  Ht 5\' 1"  (1.549 m)  Wt 155 lb (70.308 kg)  BMI 29.30 kg/m2  SpO2 100%  LMP  (LMP Unknown) or  Filed Vitals:   04/21/15 2038 04/21/15 2040 04/21/15 2047 04/21/15 2105  BP: 120/84 108/90 125/72   Pulse: 123 143 127   Temp:    99.3 F (37.4 C)  TempSrc:    Oral  Resp:      Height:      Weight:      SpO2:        Fetal monitoringBaseline: 160 bpm, Variability: Good {> 6 bpm), Accelerations: Reactive and Decelerations: Variable: moderate Uterine activityFrequency: Every 1-3.5 minutes   Dilation: 7.5 Effacement (%): 80 Cervical Position: Middle Station: -2 Presentation: Vertex Exam by:: Dr Ashok PallWouk  Labs: Lab Results  Component Value Date   WBC 9.0 04/21/2015   HGB 10.3* 04/21/2015   HCT 32.0* 04/21/2015   MCV 86.0 04/21/2015   PLT 279 04/21/2015    Patient Active Problem List   Diagnosis Date Noted  . Hypertension complicating pregnancy 04/21/2015  . History of gestational diabetes in prior pregnancy, currently pregnant 01/23/2015  . Supervision of normal pregnancy 10/03/2014  . Gallstones 05/07/2012  . Gastroenteritis 05/07/2012  . Cough 05/07/2012  . HEMORRHOIDS-EXTERNAL 11/09/2007  . RECTAL BLEEDING 11/09/2007  . ANXIETY DEPRESSION 11/23/2006  . MIGRAINE HEADACHE 11/23/2006  . OVARIAN CYST 11/23/2006  . ANAL FISSURE, HX OF 11/23/2006  . OVARIAN CYSTECTOMY, HX OF 11/23/2006    Assessment / Plan: 35 y.o. G3P1011 at 1210w4d here for IOL 2/2 gHTN Fetus with repetitive variable decels.  Last one lasting 3 minutes.  Patient repositioned with good response.  Terbutaline administered.  IUPC with amnioinfusion @ 150cc placed by Dr Ashok PallWouk.  FSE also placed.  Will continue to  monitor closely.  Patient's labor progressing rapidly with a cervical change of ~3.5 cm in the last hour.  Labor: progressing rapidly Fetal Wellbeing:  Cat 2 Pain Control:  Epidural in place Anticipated MOD:  SVD  Delynn FlavinAshly Adalena Abdulla, DO 04/21/2015, 9:07 PM

## 2015-04-21 NOTE — Progress Notes (Signed)
LABOR PROGRESS NOTE  Kim AlmasVera Sharri Carrillo is a 35 y.o. G3P1011 at 3853w4d  admitted for IOL secondary to gHTN  Subjective: Continues to have lates, repetitive variables.  Patient on LL recumbent position.  Objective: BP 124/64 mmHg  Pulse 140  Temp(Src) 100.3 F (37.9 C) (Axillary)  Resp 20  Ht 5\' 1"  (1.549 m)  Wt 155 lb (70.308 kg)  BMI 29.30 kg/m2  SpO2 100%  LMP  (LMP Unknown) or  Filed Vitals:   04/21/15 2158 04/21/15 2201 04/21/15 2221 04/21/15 2223  BP: 153/65 149/56  124/64  Pulse: 130 129  140  Temp:   100.3 F (37.9 C)   TempSrc:   Axillary   Resp: 20     Height:      Weight:      SpO2:        Fetal monitoringBaseline: 165-170 bpm, Variability: Good {> 6 bpm), Accelerations: none and Decelerations: repetitive variables/ lates present Uterine activityFrequency: Every 2-3 minutes   Dilation: Lip/rim Effacement (%): 90 Cervical Position: Anterior Station: -1 Presentation: Vertex Exam by:: Genelle BalE. Johnson, RN   Labs: Lab Results  Component Value Date   WBC 9.0 04/21/2015   HGB 10.3* 04/21/2015   HCT 32.0* 04/21/2015   MCV 86.0 04/21/2015   PLT 279 04/21/2015    Patient Active Problem List   Diagnosis Date Noted  . Hypertension complicating pregnancy 04/21/2015  . History of gestational diabetes in prior pregnancy, currently pregnant 01/23/2015  . Supervision of normal pregnancy 10/03/2014  . Gallstones 05/07/2012  . Gastroenteritis 05/07/2012  . Cough 05/07/2012  . HEMORRHOIDS-EXTERNAL 11/09/2007  . RECTAL BLEEDING 11/09/2007  . ANXIETY DEPRESSION 11/23/2006  . MIGRAINE HEADACHE 11/23/2006  . OVARIAN CYST 11/23/2006  . ANAL FISSURE, HX OF 11/23/2006  . OVARIAN CYSTECTOMY, HX OF 11/23/2006    Assessment / Plan: 35 y.o. G3P1011 at 5553w4d here for IOL 2/2 gHTN Fetus with repetitive variables.  Patient repositioned, bolused and Tylenol given.  Patient tachy to 130-140s.  IUPC with amnioinfusion in place.  FSE in place.  Maternal temp to 100.10F  Amp and  Gent ordered.  Labor: fully dilated but still high. Fetal Wellbeing:  Cat 2 Pain Control:  Epidural in place Anticipated MOD:  SVD  Delynn FlavinAshly Gottschalk, DO 04/21/2015, 10:29 PM

## 2015-04-21 NOTE — Anesthesia Procedure Notes (Signed)
Epidural Patient location during procedure: OB  Staffing Anesthesiologist: Marcene DuosFITZGERALD, Damion Kant Performed by: anesthesiologist   Preanesthetic Checklist Completed: patient identified, site marked, surgical consent, pre-op evaluation, timeout performed, IV checked, risks and benefits discussed and monitors and equipment checked  Epidural Patient position: sitting Prep: site prepped and draped and DuraPrep Patient monitoring: continuous pulse ox and blood pressure Approach: midline Location: L4-L5 Injection technique: LOR saline  Needle:  Needle type: Tuohy  Needle gauge: 17 G Needle length: 9 cm and 9 Needle insertion depth: 6 cm Catheter type: closed end flexible Catheter size: 19 Gauge Catheter at skin depth: 12 (11cm initially at the skin. Advanced to 12 when laid in left lat decubitus position.) cm Test dose: negative  Assessment Events: blood not aspirated, injection not painful, no injection resistance, negative IV test and no paresthesia

## 2015-04-21 NOTE — Progress Notes (Signed)
ANTIBIOTIC CONSULT NOTE - INITIAL  Pharmacy Consult for Gentamicin Indication: Triple I  No Known Allergies  Patient Measurements: Height: 5\' 1"  (154.9 cm) Weight: 155 lb (70.308 kg) IBW/kg (Calculated) : 47.8 Adjusted Body Weight: 54.6 Kg  Vital Signs: Temp: 100.3 F (37.9 C) (04/08 2221) Temp Source: Axillary (04/08 2221) BP: 120/59 mmHg (04/08 2246) Pulse Rate: 128 (04/08 2246)  Labs:  Recent Labs  04/21/15 0724 04/21/15 0818  WBC  --  9.0  HGB  --  10.3*  PLT  --  279  LABCREA 67.00  --   CREATININE  --  0.50   No results for input(s): GENTTROUGH, GENTPEAK, GENTRANDOM in the last 72 hours.   Microbiology: Recent Results (from the past 720 hour(s))  OB RESULT CONSOLE Group B Strep     Status: None   Collection Time: 03/26/15 12:00 AM  Result Value Ref Range Status   GBS Negative  Final  GC/Chlamydia Probe Amp     Status: None   Collection Time: 03/26/15  3:00 PM  Result Value Ref Range Status   Chlamydia trachomatis, NAA Negative Negative Final   Neisseria gonorrhoeae by PCR Negative Negative Final  Strep Gp B NAA     Status: None   Collection Time: 03/26/15  3:00 PM  Result Value Ref Range Status   Strep Gp B NAA Negative Negative Final    Comment: Centers for Disease Control and Prevention (CDC) and American Congress of Obstetricians and Gynecologists (ACOG) guidelines for prevention of perinatal group B streptococcal (GBS) disease specify co-collection of a vaginal and rectal swab specimen to maximize sensitivity of GBS detection. Per the CDC and ACOG, swabbing both the lower vagina and rectum substantially increases the yield of detection compared with sampling the vagina alone. Penicillin G, ampicillin, or cefazolin are indicated for intrapartum prophylaxis of perinatal GBS colonization. Reflex susceptibility testing should be performed prior to use of clindamycin only on GBS isolates from penicillin-allergic women who are considered a high risk for  anaphylaxis. Treatment with vancomycin without additional testing is warranted if resistance to clindamycin is noted.     Medications:  Ampicillin 2 Gm IV every 6 hours  Assessment: 35 y.o. female G3P1011 at 5250w4d admitted for IOL; now with increased temperature Estimated Ke = 0.353, Vd = 21.8 Liters  Goal of Therapy:  Gentamicin peak 6-8 mg/L and Trough < 1 mg/L  Plan:  Gentamicin 150 mg IV x 1  Gentamicin 140 mg IV every 8 hrs  Check Scr with next labs if gentamicin continued. Will check gentamicin levels if continued > 72hr or clinically indicated.  Arelia SneddonMason, Laker Thompson Anne 04/21/2015,10:46 PM

## 2015-04-21 NOTE — Progress Notes (Signed)
LABOR PROGRESS NOTE  Kim AlmasVera Sharri Carrillo is a 35 y.o. G3P1011 at 5185w4d  admitted for iol for ghtn  Subjective: Painful contractions. No ha, vision change, epigastric/ruq pain, or sob  Objective: BP 152/95 mmHg  Pulse 120  Temp(Src) 98.6 F (37 C) (Oral)  Resp 20  Ht 5\' 1"  (1.549 m)  Wt 155 lb (70.308 kg)  BMI 29.30 kg/m2  SpO2 94%  LMP  (LMP Unknown) or  Filed Vitals:   04/21/15 1721 04/21/15 1826 04/21/15 1828 04/21/15 1831  BP: 146/98 160/105 148/105 152/95  Pulse: 113 95 122 120  Temp:      TempSrc:      Resp: 20     Height:      Weight:      SpO2:        140/mod/+a/variables after SROM  Dilation: 2.5 Effacement (%): 70 Station: -2 Presentation: Vertex Exam by:: Foye ClockS. Oklesh RN  Labs: Lab Results  Component Value Date   WBC 9.0 04/21/2015   HGB 10.3* 04/21/2015   HCT 32.0* 04/21/2015   MCV 86.0 04/21/2015   PLT 279 04/21/2015    Patient Active Problem List   Diagnosis Date Noted  . Hypertension complicating pregnancy 04/21/2015  . History of gestational diabetes in prior pregnancy, currently pregnant 01/23/2015  . Supervision of normal pregnancy 10/03/2014  . Gallstones 05/07/2012  . Gastroenteritis 05/07/2012  . Cough 05/07/2012  . HEMORRHOIDS-EXTERNAL 11/09/2007  . RECTAL BLEEDING 11/09/2007  . ANXIETY DEPRESSION 11/23/2006  . MIGRAINE HEADACHE 11/23/2006  . OVARIAN CYST 11/23/2006  . ANAL FISSURE, HX OF 11/23/2006  . OVARIAN CYSTECTOMY, HX OF 11/23/2006    Assessment / Plan: 35 y.o. G3P1011 at 6885w4d here for iol for ghtn  Labor: now s/p cytotec and srom. Contracting painfully and regularly. Witholding further augmentation for now. Fetal Wellbeing:  Cat 1 currently. Run of variables after srom now resolved Pain Control:  Fentanyl > epidural Anticipated MOD:  Vaginal GHTN: asymptomatic. One borderline severe BP, otherwise wnl to moderate elevations. Continuing to monitor.  Silvano BilisNoah B Uzair Godley, MD 04/21/2015, 6:49 PM  3

## 2015-04-21 NOTE — Anesthesia Preprocedure Evaluation (Signed)
Anesthesia Evaluation  Patient identified by MRN, date of birth, ID band Patient awake    Reviewed: Allergy & Precautions, NPO status , Patient's Chart, lab work & pertinent test results  Airway Mallampati: II       Dental   Pulmonary former smoker,    Pulmonary exam normal        Cardiovascular negative cardio ROS Normal cardiovascular exam     Neuro/Psych  Headaches, Anxiety Depression    GI/Hepatic negative GI ROS, Neg liver ROS,   Endo/Other  negative endocrine ROS  Renal/GU negative Renal ROS     Musculoskeletal   Abdominal   Peds  Hematology negative hematology ROS (+)   Anesthesia Other Findings   Reproductive/Obstetrics (+) Pregnancy                             Lab Results  Component Value Date   WBC 9.0 04/21/2015   HGB 10.3* 04/21/2015   HCT 32.0* 04/21/2015   MCV 86.0 04/21/2015   PLT 279 04/21/2015   Lab Results  Component Value Date   CREATININE 0.50 04/21/2015   BUN 5* 04/21/2015   NA 137 04/21/2015   K 4.0 04/21/2015   CL 109 04/21/2015   CO2 22 04/21/2015    Anesthesia Physical Anesthesia Plan  ASA: II  Anesthesia Plan: Epidural   Post-op Pain Management:    Induction:   Airway Management Planned: Natural Airway  Additional Equipment:   Intra-op Plan:   Post-operative Plan:   Informed Consent: I have reviewed the patients History and Physical, chart, labs and discussed the procedure including the risks, benefits and alternatives for the proposed anesthesia with the patient or authorized representative who has indicated his/her understanding and acceptance.     Plan Discussed with:   Anesthesia Plan Comments:         Anesthesia Quick Evaluation

## 2015-04-21 NOTE — MAU Provider Note (Signed)
Chief Complaint:  Labor Eval   First Provider Initiated Contact with Patient 04/21/15 [phone]     HPI  HPI: Kim Carrillo is a 35 y.o. G3P1011 at 26w4dwho presents to maternity admissions reporting contractions. While being evaluated for labor, RN noted elevation in BP.   So I was called to evaluate her for that.. She reports good fetal movement, denies LOF, vaginal bleeding, vaginal itching/burning, urinary symptoms, h/a, dizziness, n/v, diarrhea, constipation or fever/chills.  She denies  visual changes.   Reports headache since yesterday and her eyes "feel funny"   Past Medical History: Past Medical History  Diagnosis Date  . Anxiety   . Migraine headache   . ANAL FISSURE, HX OF 11/23/2006    Qualifier: Diagnosis of  By: Genelle Gather CMA, [place]    . MIGRAINE HEADACHE 11/23/2006    Qualifier: Diagnosis of  By: Genelle Gather CMA, [place]    . Gestational diabetes     previous pregnancy  . Complication of anesthesia     didn't fall asleep during sigmoidoscopy  . ANXIETY DEPRESSION 11/23/2006    Qualifier: Diagnosis of  By: Genelle Gather CMA, [place]      Past obstetric history: OB History  Gravida Para Term Preterm AB SAB TAB Ectopic Multiple Living  0 1 0 1 0 0 1    # Outcome Date GA Lbr Len/2nd Weight Sex Delivery Anes PTL Lv  3 Current           2 TAB 05/19/06          1 Term 05/19/06 [redacted]w[redacted]d  6 lb 9 oz (2.977 kg)  Vag-Spont   Y      Past Surgical History: Past Surgical History  Procedure Laterality Date  . Ovarian cyst surgery    . Dilation and curettage of uterus  2008    TAB X 1    Family History: Family History  Problem Relation Age of Onset  . Hypertension Mother   . Hypertension Maternal Grandfather   . Heart attack Paternal Grandmother   . Gallstones Paternal Grandfather     Social History: Social History  Substance Use Topics  . Smoking status: Former Smoker -- 2 years    Types: Cigarettes  . Smokeless tobacco: Never Used  . Alcohol Use: No     Allergies: No Known Allergies  Meds:  Prescriptions prior to admission  Medication Sig Dispense Refill Last Dose  . acetaminophen (TYLENOL) 325 MG tablet Take 650 mg by mouth every 6 (six) hours as needed for headache.    04/20/2015 at Unknown time  . ondansetron (ZOFRAN-ODT) 8 MG disintegrating tablet Take 1 tablet (8 mg total) by mouth once. (Patient taking differently: Take 8 mg by mouth as needed for nausea. ) 40 tablet 2 Past Week at Unknown time  . Pediatric Multiple Vit-C-FA (FLINSTONES GUMMIES OMEGA-3 DHA PO) Take 3 each by mouth daily.    04/20/2015 at Unknown time  . calcium carbonate (TUMS - DOSED IN MG ELEMENTAL CALCIUM) 500 MG chewable tablet Chew 1 tablet by mouth as needed for heartburn.    prn  . diphenhydrAMINE (BENADRYL) 12.5 MG/5ML liquid Take 12.5 mg by mouth 4 (four) times daily as needed for itching, allergies or sleep.    prn    I have reviewed patient's Past Medical Hx, Surgical Hx, Family Hx, Social Hx, medications and allergies.   ROS:  Review of Systems  Constitutional: Negative for fever, chills and fatigue.  Eyes: Negative for visual disturbance.  Respiratory: Negative  for shortness of breath.   Cardiovascular: Negative for leg swelling.  Gastrointestinal: Positive for abdominal pain. Negative for nausea, vomiting, diarrhea and constipation.  Genitourinary: Positive for vaginal discharge. Negative for dysuria, vaginal bleeding and vaginal pain.  Musculoskeletal: Negative for back pain.  Neurological: Positive for headaches. Negative for seizures and weakness.   Other systems negative  Physical Exam  Patient Vitals for the past 24 hrs:  BP Temp Temp src Pulse Resp Height Weight  04/21/15 0753 122/95 mmHg - - (!) 123 - - -  04/21/15 0751 136/100 mmHg - - 100 - - -  04/21/15 0739 136/94 mmHg 98.5 F (36.9 C) Oral 115 20  (1.549 m) 155 lb 6.4 oz (70.489 kg)   Constitutional: Well-developed, well-nourished female in no acute distress.   Cardiovascular: normal rate and rhythm Respiratory: normal effort, clear to auscultation bilaterally GI: Abd soft, non-tender, gravid appropriate for gestational age.   No rebound or guarding. MS: Extremities nontender, no edema, normal ROM Neurologic: Alert and oriented x 4.  DTRs 1+ with no clonus GU: Neg CVAT.  PELVIC EXAM: Cervix soft, posterior, neg CMT, uterus nontender, Fundal Height consistent with dates, adnexa without tenderness, enlargement, or mass  Dilation: 1.5 Effacement (%): 50 Station: -3 Presentation: Vertex Exam by:: Dione Plover RN  FHT:  Baseline 140 , moderate variability, accelerations present, no decelerations Contractions: Irregular     Labs: Results for orders placed or performed during the hospital encounter of 04/21/15 (from the past 24 hour(s))  CBC     Status: Abnormal   Collection Time: 04/21/15  8:18 AM  Result Value Ref Range   WBC 9.0 4.0 - 10.5 K/uL   RBC 3.72 (L) 3.87 - 5.11 MIL/uL   Hemoglobin 10.3 (L) 12.0 - 15.0 g/dL   HCT 96.0 (L) 45.4 - 09.8 %   MCV 86.0 78.0 - 100.0 fL   MCH 27.7 26.0 - 34.0 pg   MCHC 32.2 30.0 - 36.0 g/dL   RDW 11.9 14.7 - 82.9 %   Platelets 279 150 - 400 K/uL   O/Positive/-- (09/20 1650)  Imaging:  No results found.  MAU Course/MDM: I have ordered labs and reviewed results with Dr Debroah Loop.  NST reviewed Consult Dr Debroah Loop with presentation, exam findings and test results.     Assessment: 1. Supervision of normal pregnancy, third trimester   2.     New onset of hypertension in pregnancy, no evidence of preeclampsia  Plan: Admit to [facility] Routine orders Induction of labor for Gestational hypertension at term    Medication List    ASK your doctor about these medications        acetaminophen 325 MG tablet  Commonly known as:  TYLENOL  Take 650 mg by mouth every 6 (six) hours as needed for headache.     calcium carbonate 500 MG chewable tablet  Commonly known as:  TUMS - dosed in mg elemental  calcium  Chew 1 tablet by mouth as needed for heartburn.     diphenhydrAMINE 12.5 MG/5ML liquid  Commonly known as:  BENADRYL  Take 12.5 mg by mouth 4 (four) times daily as needed for itching, allergies or sleep.     FLINSTONES GUMMIES OMEGA-3 DHA PO  Take 3 each by mouth daily.     ondansetron 8 MG disintegrating tablet  Commonly known as:  ZOFRAN-ODT  Take 1 tablet (8 mg total) by mouth once.       Pt stable at time of discharge.  Wynelle Bourgeois  CNM, MSN Certified Nurse-Midwife 04/21/2015 9:03 AM

## 2015-04-21 NOTE — MAU Note (Signed)
Pt states her provider stripped her membranes on Thursday and since then she "hasn't been right". Pt complains of pressure, ctxs, and leaking of white mucous. Pt states +fm.

## 2015-04-21 NOTE — H&P (Signed)
LABOR AND DELIVERY ADMISSION HISTORY AND PHYSICAL NOTE  Kim Carrillo is a 35 y.o. female G3P1011 with IUP at 8762w4d by 11 wk u/s presenting for leakage of fluid. MAU eval negative for ROM. 2 cm dilated, not in active labor. Mild ha, no vision change, no epigastric/ruq pain, no sob. But with persistent mild bp elevations.   She reports positive fetal movement. She denies leakage of fluid or vaginal bleeding.  Prenatal History/Complications:  Past Medical History: Past Medical History  Diagnosis Date  . Anxiety   . Migraine headache   . ANAL FISSURE, HX OF 11/23/2006    Qualifier: Diagnosis of  By: Kim Carrillo CMA, [place]    . MIGRAINE HEADACHE 11/23/2006    Qualifier: Diagnosis of  By: Kim Carrillo CMA, [place]    . Gestational diabetes     previous pregnancy  . Complication of anesthesia     didn't fall asleep during sigmoidoscopy  . ANXIETY DEPRESSION 11/23/2006    Qualifier: Diagnosis of  By: Kim Carrillo CMA, [place]      Past Surgical History: Past Surgical History  Procedure Laterality Date  . Ovarian cyst surgery    . Dilation and curettage of uterus  2008    TAB X 1    Obstetrical History: OB History    Gravida Para Term Preterm AB TAB SAB Ectopic Multiple Living   3 1 1  0 1 1 0 0 0 1      Social History: Social History   Social History  . Marital Status: Single    Spouse Name: N/A  . Number of Children: N/A  . Years of Education: N/A   Social History Main Topics  . Smoking status: Former Smoker -- 2 years    Types: Cigarettes  . Smokeless tobacco: Never Used  . Alcohol Use: No  . Drug Use: No  . Sexual Activity: Yes    Birth Control/ Protection: None   Other Topics Concern  . None   Social History Narrative    Family History: Family History  Problem Relation Age of Onset  . Hypertension Mother   . Hypertension Maternal Grandfather   . Heart attack Paternal Grandmother   . Gallstones Paternal Grandfather     Allergies: No Known  Allergies  Prescriptions prior to admission  Medication Sig Dispense Refill Last Dose  . acetaminophen (TYLENOL) 325 MG tablet Take 650 mg by mouth every 6 (six) hours as needed for headache.    04/20/2015 at Unknown time  . ondansetron (ZOFRAN-ODT) 8 MG disintegrating tablet Take 1 tablet (8 mg total) by mouth once. (Patient taking differently: Take 8 mg by mouth as needed for nausea. ) 40 tablet 2 Past Week at Unknown time  . Pediatric Multiple Vit-C-FA (FLINSTONES GUMMIES OMEGA-3 DHA PO) Take 3 each by mouth daily.    04/20/2015 at Unknown time  . calcium carbonate (TUMS - DOSED IN MG ELEMENTAL CALCIUM) 500 MG chewable tablet Chew 1 tablet by mouth as needed for heartburn.    prn  . diphenhydrAMINE (BENADRYL) 12.5 MG/5ML liquid Take 12.5 mg by mouth 4 (four) times daily as needed for itching, allergies or sleep.    prn     Review of Systems   All systems reviewed and negative except as stated in HPI  Blood pressure 132/99, pulse 103, temperature 98.5 F (36.9 C), temperature source Oral, resp. rate 18, height 5\' 1"  (1.549 m), weight 155 lb (70.308 kg), SpO2 94 %. General appearance: alert, cooperative and appears stated age Lungs: clear to  auscultation bilaterally Heart: regular rate and rhythm Abdomen: soft, non-tender; bowel sounds normal Extremities: No calf swelling or tenderness Presentation: cephalic Fetal monitoring: 140/mod/+a/-d Uterine activity: q 5-10 min  Dilation: 2 Effacement (%): 80 Station: -2 Exam by:: Kim Carrillo   Prenatal labs: ABO, Rh: --/--/O POS (04/08 0818) Antibody: NEG (04/08 0818) Rubella: !Error!imm RPR: Non Reactive (01/10 0913)  HBsAg: Negative (09/20 1650)  HIV: Non Reactive (01/10 0913)  GBS: Negative (03/13 1500)  2 hr Glucola: 78/94/73 Genetic screening:  First wnl Anatomy US: wnl  Prenatal Transfer Tool  Maternal Diabetes: No Genetic Screening: Normal Maternal Ultrasounds/Referrals: Normal Fetal Ultrasounds or other Referrals:  None Maternal  Substance Abuse:  No Significant Maternal Medications:  None Significant Maternal Lab Results: Lab values include: Group B Strep negative  Results for orders placed or performed during the hospital encounter of 04/21/15 (from the past 24 hour(s))  Protein / creatinine ratio, urine   Collection Time: 04/21/15  7:24 AM  Result Value Ref Range   Creatinine, Urine 67.00 mg/dL   Total Protein, Urine 12 mg/dL   Protein Creatinine Ratio 0.18 (H) 0.00 - 0.15 mg/mg[Cre]  Urinalysis, Routine w reflex microscopic (not at [facility])   Collection Time: 04/21/15  7:24 AM  Result Value Ref Range   Color, Urine YELLOW YELLOW   APPearance CLEAR CLEAR   Specific Gravity, Urine 1.015 1.005 - 1.030   pH 6.5 5.0 - 8.0   Glucose, UA NEGATIVE NEGATIVE mg/dL   Hgb urine dipstick NEGATIVE NEGATIVE   Bilirubin Urine NEGATIVE NEGATIVE   Ketones, ur NEGATIVE NEGATIVE mg/dL   Protein, ur NEGATIVE NEGATIVE mg/dL   Nitrite NEGATIVE NEGATIVE   Leukocytes, UA SMALL (A) NEGATIVE  Urine microscopic-add on   Collection Time: 04/21/15  7:24 AM  Result Value Ref Range   Squamous Epithelial / LPF 6-30 (A) NONE SEEN   WBC, UA 6-30 0 - 5 WBC/hpf   RBC / HPF 0-5 0 - 5 RBC/hpf   Bacteria, UA MANY (A) NONE SEEN  Comprehensive metabolic panel   Collection Time: 04/21/15  8:18 AM  Result Value Ref Range   Sodium 137 135 - 145 mmol/L   Potassium 4.0 3.5 - 5.1 mmol/L   Chloride 109 101 - 111 mmol/L   CO2 22 22 - 32 mmol/L   Glucose, Bld 102 (H) 65 - 99 mg/dL   BUN 5 (L) 6 - 20 mg/dL   Creatinine, Ser 4.09 0.44 - 1.00 mg/dL   Calcium 8.3 (L) 8.9 - 10.3 mg/dL   Total Protein 5.8 (L) 6.5 - 8.1 g/dL   Albumin 2.5 (L) 3.5 - 5.0 g/dL   AST 20 15 - 41 U/L   ALT 12 (L) 14 - 54 U/L   Alkaline Phosphatase 195 (H) 38 - 126 U/L   Total Bilirubin 0.3 0.3 - 1.2 mg/dL   GFR calc non Af Amer >60 >60 mL/min   GFR calc Af Amer >60 >60 mL/min   Anion gap 6 5 - 15  CBC   Collection Time: 04/21/15  8:18 AM  Result Value Ref Range    WBC 9.0 4.0 - 10.5 K/uL   RBC 3.72 (L) 3.87 - 5.11 MIL/uL   Hemoglobin 10.3 (L) 12.0 - 15.0 g/dL   HCT 81.1 (L) 91.4 - 78.2 %   MCV 86.0 78.0 - 100.0 fL   MCH 27.7 26.0 - 34.0 pg   MCHC 32.2 30.0 - 36.0 g/dL   RDW 95.6 21.3 - 08.6 %   Platelets 279 150 -  400 K/uL  Type and screen [facility]   Collection Time: 04/21/15  8:18 AM  Result Value Ref Range   ABO/RH(D) O POS    Antibody Screen NEG    Sample Expiration 04/24/2015   Amnisure rupture of membrane (rom)not at [facility]   Collection Time: 04/21/15  9:40 AM  Result Value Ref Range   Amnisure ROM NEGATIVE     Patient Active Problem List   Diagnosis Date Noted  . Hypertension complicating pregnancy 04/21/2015  . History of gestational diabetes in prior pregnancy, currently pregnant 01/23/2015  . Supervision of normal pregnancy 10/03/2014  . Gallstones 05/07/2012  . Gastroenteritis 05/07/2012  . Cough 05/07/2012  . HEMORRHOIDS-EXTERNAL 11/09/2007  . RECTAL BLEEDING 11/09/2007  . ANXIETY DEPRESSION 11/23/2006  . MIGRAINE HEADACHE 11/23/2006  . OVARIAN CYST 11/23/2006  . ANAL FISSURE, HX OF 11/23/2006  . OVARIAN CYSTECTOMY, HX OF 11/23/2006    Assessment: Kim Carrillo is a 35 y.o. G3P1011 at [redacted]w[redacted]d here for iol for ghtn  #Labor: cytotec, does not want foley for now #Pain: Eventual epidural #FWB: Cat 1 #ID:  gbs neg #MOF: breast #MOC: vas or btl #Circ:  N/a #GHTN: mild elevations, mild ha otherwise asymptomatic, labs wnl. Will continue to monitor. IOL as above.  Kim Carrillo 04/21/2015, 11:26 AM

## 2015-04-21 NOTE — MAU Note (Signed)
Placed pulse ox on patient explained to patient cannot sit straight up as we need to monitor fhr, patient took blood pressure cuff off and refused to put back on.

## 2015-04-22 ENCOUNTER — Encounter (HOSPITAL_COMMUNITY): Payer: Self-pay

## 2015-04-22 DIAGNOSIS — Z3A40 40 weeks gestation of pregnancy: Secondary | ICD-10-CM

## 2015-04-22 DIAGNOSIS — O134 Gestational [pregnancy-induced] hypertension without significant proteinuria, complicating childbirth: Secondary | ICD-10-CM

## 2015-04-22 DIAGNOSIS — Z8632 Personal history of gestational diabetes: Secondary | ICD-10-CM

## 2015-04-22 DIAGNOSIS — O99344 Other mental disorders complicating childbirth: Secondary | ICD-10-CM

## 2015-04-22 DIAGNOSIS — Z87891 Personal history of nicotine dependence: Secondary | ICD-10-CM

## 2015-04-22 LAB — CBC
HCT: 30.3 % — ABNORMAL LOW (ref 36.0–46.0)
HEMATOCRIT: 29.1 % — AB (ref 36.0–46.0)
HEMOGLOBIN: 9.7 g/dL — AB (ref 12.0–15.0)
Hemoglobin: 9.2 g/dL — ABNORMAL LOW (ref 12.0–15.0)
MCH: 27.3 pg (ref 26.0–34.0)
MCH: 27.9 pg (ref 26.0–34.0)
MCHC: 31.6 g/dL (ref 30.0–36.0)
MCHC: 32 g/dL (ref 30.0–36.0)
MCV: 86.4 fL (ref 78.0–100.0)
MCV: 87.1 fL (ref 78.0–100.0)
PLATELETS: 249 10*3/uL (ref 150–400)
Platelets: 231 10*3/uL (ref 150–400)
RBC: 3.37 MIL/uL — AB (ref 3.87–5.11)
RBC: 3.48 MIL/uL — AB (ref 3.87–5.11)
RDW: 14.9 % (ref 11.5–15.5)
RDW: 14.9 % (ref 11.5–15.5)
WBC: 16 10*3/uL — AB (ref 4.0–10.5)
WBC: 16 10*3/uL — AB (ref 4.0–10.5)

## 2015-04-22 LAB — RPR: RPR Ser Ql: NONREACTIVE

## 2015-04-22 LAB — HIV ANTIBODY (ROUTINE TESTING W REFLEX): HIV SCREEN 4TH GENERATION: NONREACTIVE

## 2015-04-22 MED ORDER — TETANUS-DIPHTH-ACELL PERTUSSIS 5-2.5-18.5 LF-MCG/0.5 IM SUSP: 0.5000 mL | Freq: Once | INTRAMUSCULAR | Status: DC

## 2015-04-22 MED ORDER — DIPHENHYDRAMINE HCL 25 MG PO CAPS: 25.0000 mg | ORAL_CAPSULE | Freq: Four times a day (QID) | ORAL | Status: DC | PRN

## 2015-04-22 MED ORDER — IBUPROFEN 600 MG PO TABS
600.0000 mg | ORAL_TABLET | Freq: Four times a day (QID) | ORAL | Status: DC
  Administered 2015-04-22 – 2015-04-23 (×6): 600 mg via ORAL
  Filled 2015-04-22 (×6): qty 1

## 2015-04-22 MED ORDER — ZOLPIDEM TARTRATE 5 MG PO TABS: 5.0000 mg | ORAL_TABLET | Freq: Every evening | ORAL | Status: DC | PRN

## 2015-04-22 MED ORDER — ONDANSETRON HCL 4 MG/2ML IJ SOLN: 4.0000 mg | INTRAMUSCULAR | Status: DC | PRN

## 2015-04-22 MED ORDER — SENNOSIDES-DOCUSATE SODIUM 8.6-50 MG PO TABS
2.0000 | ORAL_TABLET | ORAL | Status: DC
  Administered 2015-04-22: 2 via ORAL
  Filled 2015-04-22: qty 2

## 2015-04-22 MED ORDER — SIMETHICONE 80 MG PO CHEW: 80.0000 mg | CHEWABLE_TABLET | ORAL | Status: DC | PRN

## 2015-04-22 MED ORDER — WITCH HAZEL-GLYCERIN EX PADS
1.0000 | MEDICATED_PAD | CUTANEOUS | Status: DC | PRN
Start: 2015-04-22 — End: 2015-04-23

## 2015-04-22 MED ORDER — BENZOCAINE-MENTHOL 20-0.5 % EX AERO
1.0000 "application " | INHALATION_SPRAY | CUTANEOUS | Status: DC | PRN
  Administered 2015-04-22: 1 via TOPICAL
  Filled 2015-04-22: qty 56

## 2015-04-22 MED ORDER — LANOLIN HYDROUS EX OINT
TOPICAL_OINTMENT | CUTANEOUS | Status: DC | PRN
Start: 2015-04-22 — End: 2015-04-23

## 2015-04-22 MED ORDER — DIBUCAINE 1 % RE OINT: 1.0000 "application " | TOPICAL_OINTMENT | RECTAL | Status: DC | PRN

## 2015-04-22 MED ORDER — PRENATAL MULTIVITAMIN CH
1.0000 | ORAL_TABLET | Freq: Every day | ORAL | Status: DC
  Administered 2015-04-22 – 2015-04-23 (×2): 1 via ORAL
  Filled 2015-04-22 (×2): qty 1

## 2015-04-22 MED ORDER — ACETAMINOPHEN 325 MG PO TABS: 650.0000 mg | ORAL_TABLET | ORAL | Status: DC | PRN

## 2015-04-22 MED ORDER — OXYCODONE HCL 5 MG PO TABS: 10.0000 mg | ORAL_TABLET | ORAL | Status: DC | PRN

## 2015-04-22 MED ORDER — SODIUM CHLORIDE 0.9 % IV SOLN
2.0000 g | Freq: Four times a day (QID) | INTRAVENOUS | Status: AC
  Administered 2015-04-22: 2 g via INTRAVENOUS
  Filled 2015-04-22: qty 2000

## 2015-04-22 MED ORDER — ONDANSETRON HCL 4 MG PO TABS: 4.0000 mg | ORAL_TABLET | ORAL | Status: DC | PRN

## 2015-04-22 MED ORDER — OXYCODONE HCL 5 MG PO TABS
5.0000 mg | ORAL_TABLET | ORAL | Status: DC | PRN
  Administered 2015-04-22 – 2015-04-23 (×2): 5 mg via ORAL
  Filled 2015-04-22 (×2): qty 1

## 2015-04-22 MED ORDER — GENTAMICIN SULFATE 40 MG/ML IJ SOLN
140.0000 mg | Freq: Three times a day (TID) | INTRAVENOUS | Status: AC
  Administered 2015-04-22: 140 mg via INTRAVENOUS
  Filled 2015-04-22: qty 3.5

## 2015-04-22 NOTE — Lactation Note (Signed)
This note was copied from a baby's chart. Lactation Consultation Note  Patient Name: Kim Sharen HonesVera Busser [id number]'s Date: 04/22/2015   Initial consult at 15 hrs old NICU baby; GA 40.4; BW 7 lbs, 11.5 oz.  Admitted to NICU for respiratory distress.  Mom is a P2. Mom has pumped once today and breastfed infant in NICU once today.  Getting drops. Reviewed pumping on preemie/ initiate setting and reviewed NICU booklet. Reviewed hands-on pumping and started mom's pumping log.  Encouraged to pump every 2 hrs during the day and at least once during the night for at least 8 pumping or more per 24 hrs. Taught hands-on pumping. NICU called during consult for mom to return for a feeding. Encouraged to call for assistance as needed.    Maternal Data    Feeding Feeding Type: Formula Nipple Type: Slow - flow Length of feed: 5 min  LATCH Score/Interventions Latch: Grasps breast easily, tongue down, lips flanged, rhythmical sucking.  Audible Swallowing: Spontaneous and intermittent  Type of Nipple: Everted at rest and after stimulation  Comfort (Breast/Nipple): Soft / non-tender     Hold (Positioning): No assistance needed to correctly position infant at breast.  LATCH Score: 10  Lactation Tools Discussed/Used     Consult Status      Lendon KaVann, Zayaan Kozak Walker 04/22/2015, 7:36 PM

## 2015-04-22 NOTE — Progress Notes (Signed)
POSTPARTUM PROGRESS NOTE  Post Partum Day 1 Subjective:  Kim Carrillo is a 35 y.o. [id number] 4388w4d s/p nsvd. Afebrile, no n/v.  No acute events overnight.  Pt denies problems with ambulating, voiding or po intake.  She denies nausea or vomiting.  Pain is well controlled.  She has not had flatus. She has not had bowel movement.  Lochia Small.   Objective: Blood pressure 130/59, pulse 86, temperature 99.1 F (37.3 C), temperature source Oral, resp. rate 20, height 5\' 1"  (1.549 m), weight 155 lb (70.308 kg), SpO2 100 %, unknown if currently breastfeeding.  Physical Exam:  General: alert, cooperative and no distress Lochia:normal flow Chest: CTAB Heart: RRR no m/r/g Abdomen: +BS, soft, nontender,  Uterine Fundus: firm,  DVT Evaluation: No calf swelling or tenderness Extremities: trace edema   Recent Labs  04/22/15 0030 04/22/15 0526  HGB 9.7* 9.2*  HCT 30.3* 29.1*    Assessment/Plan:  ASSESSMENT: Kim Carrillo is a 35 y.o. [id number] 6188w4d s/p nsvd, doing well. Treated for triple i, now off abx, afebrile and asymptomatic.   Plan for discharge tomorrow   LOS: 1 day   Silvano Bilisoah B Makailee Nudelman 04/22/2015, 7:37 AM

## 2015-04-22 NOTE — Anesthesia Postprocedure Evaluation (Addendum)
Anesthesia Post Note  Patient: Kim Carrillo  Procedure(s) Performed: * No procedures listed *  Patient location during evaluation: Women's Unit Anesthesia Type: Epidural Level of consciousness: awake Pain management: satisfactory to patient Vital Signs Assessment: post-procedure vital signs reviewed and stable Respiratory status: spontaneous breathing Cardiovascular status: stable Anesthetic complications: no    Last Vitals:  Filed Vitals:   04/22/15 0400 04/22/15 0823  BP: 130/59 132/82  Pulse: 86 71  Temp: 37.3 C 36.6 C  Resp: 20 18    Last Pain:  Filed Vitals:   04/22/15 0824  PainSc: 0-No pain                 Keyia Moretto

## 2015-04-23 MED ORDER — IBUPROFEN 600 MG PO TABS: 600.0000 mg | ORAL_TABLET | Freq: Four times a day (QID) | ORAL | Status: DC

## 2015-04-23 NOTE — Discharge Summary (Signed)
OB Discharge Summary  Patient Name: Kim Carrillo DOB: 18-May-1980 MRN: [id number]  Date of admission: 04/21/2015 Delivering MD: Raliegh Ip   Date of discharge: 04/23/2015  Admitting diagnosis: 41WKS WATER BROKE CONTRACTIONS Intrauterine pregnancy: [redacted]w[redacted]d     Secondary diagnosis:Principal Problem:   SVD (spontaneous vaginal delivery) Active Problems:   Hypertension complicating pregnancy  Additional problems:triple I     Discharge diagnosis: Term Pregnancy Delivered                                                                     Post partum procedures:none  Augmentation: Pitocin  Complications: Intrauterine Inflammation or infection (Chorioamniotis)  Hospital course:  Onset of Labor With Vaginal Delivery     35 y.o. yo [id number] at [redacted]w[redacted]d was admitted in Active Labor on 04/21/2015. Patient had an uncomplicated labor course as follows:  Membrane Rupture Time/Date: 6:31 PM ,04/21/2015   Intrapartum Procedures: Episiotomy: None [1]                                         Lacerations:  None [1]  Patient had a delivery of a Viable infant. 04/21/2015  Information for the patient's newborn:  Robyne, Matar [[id number]]  Delivery Method: Vaginal, Spontaneous Delivery (Filed from Delivery Summary)    Pateint had an uncomplicated postpartum course.  She is ambulating, tolerating a regular diet, passing flatus, and urinating well. Patient is discharged home in stable condition on 04/23/2015.    Physical exam  Filed Vitals:   04/22/15 1458 04/22/15 1744 04/22/15 2128 04/23/15 0545  BP: 150/85 124/81 134/86 112/71  Pulse: 71 73 61 78  Temp: 98.7 F (37.1 C)  98.2 F (36.8 C) 97.9 F (36.6 C)  TempSrc: Oral  Oral Oral  Resp: Height:      Weight:      SpO2: 98%  99% 98%   General: alert, cooperative and no distress Lochia: appropriate Uterine Fundus: firm Incision: N/A DVT Evaluation: No evidence of DVT seen on physical exam. Negative  Homan's sign. No cords or calf tenderness. No significant calf/ankle edema. Labs: Lab Results  Component Value Date   WBC 16.0* 04/22/2015   HGB 9.2* 04/22/2015   HCT 29.1* 04/22/2015   MCV 86.4 04/22/2015   PLT 231 04/22/2015   CMP Latest Ref Rng 04/21/2015  Glucose 65 - 99 mg/dL 130(Q)  BUN 6 - 20 mg/dL 5(L)  Creatinine 6.57 - 1.00 mg/dL 8.46  Sodium 962 - 952 mmol/L 137  Potassium 3.5 - 5.1 mmol/L 4.0  Chloride 101 - 111 mmol/L 109  CO2 22 - 32 mmol/L 22  Calcium 8.9 - 10.3 mg/dL 8.3(L)  Total Protein 6.5 - 8.1 g/dL 8.4(X)  Total Bilirubin 0.3 - 1.2 mg/dL 0.3  Alkaline Phos 38 - 126 U/L 195(H)  AST 15 - 41 U/L 20  ALT 14 - 54 U/L 12(L)    Discharge instruction: per After Visit Summary and "Baby and Me Booklet".  After Visit Meds:    Medication List    TAKE these medications        acetaminophen 325 MG tablet  Commonly known as:  TYLENOL  Take 650 mg by mouth every 6 (six) hours as needed for headache.     calcium carbonate 500 MG chewable tablet  Commonly known as:  TUMS - dosed in mg elemental calcium  Chew 1 tablet by mouth as needed for heartburn.     diphenhydrAMINE 12.5 MG/5ML liquid  Commonly known as:  BENADRYL  Take 12.5 mg by mouth 4 (four) times daily as needed for itching, allergies or sleep.     FLINSTONES GUMMIES OMEGA-3 DHA PO  Take 3 each by mouth daily.     ibuprofen 600 MG tablet  Commonly known as:  ADVIL,MOTRIN  Take 1 tablet (600 mg total) by mouth every 6 (six) hours.     ondansetron 8 MG disintegrating tablet  Commonly known as:  ZOFRAN-ODT  Take 1 tablet (8 mg total) by mouth once.        Diet: routine diet  Activity: Advance as tolerated. Pelvic rest for 6 weeks.   Outpatient follow up:2 weeks Follow up Appt:No future appointments. Follow up visit: No Follow-up on file.  Postpartum contraception: Tubal Ligation  Newborn Data: Live born female  Birth Weight: 7 lb 11.5 oz (3500 g) APGAR: 6, 7  Baby Feeding:  Bottle Disposition:NICU   04/23/2015 Lamond Glantz, Gershon MusselMARIE DARLENE, CNM

## 2015-04-23 NOTE — Lactation Note (Signed)
This note was copied from a baby's chart. Lactation Consultation Note  Follow up visit made prior to mother's discharge.  Baby is full term and has been breastfeeding well.  Mom continues to pump and obtaining small amounts of colostrum.  Mom has a DEBP at home.  Instructed to bring her symphony pieces with her when she visits NICU.  Discussed milk coming to volume.  Mom states nipples feel sensitive between pumpings.  Comfort gels given with instructions.  Encouraged to call with concerns prn.  Patient Name: Kim Carrillo ZOXWR'UToday's Date: 04/23/2015     Maternal Data    Feeding Feeding Type: Formula Nipple Type: Slow - flow Length of feed: 20 min  LATCH Score/Interventions Latch: Grasps breast easily, tongue down, lips flanged, rhythmical sucking.  Audible Swallowing: Spontaneous and intermittent  Type of Nipple: Everted at rest and after stimulation  Comfort (Breast/Nipple): Soft / non-tender     Hold (Positioning): No assistance needed to correctly position infant at breast.  LATCH Score: 10  Lactation Tools Discussed/Used     Consult Status      Huston FoleyMOULDEN, Jaray Boliver S 04/23/2015, 11:29 AM

## 2015-04-23 NOTE — Progress Notes (Signed)
CLINICAL SOCIAL WORK MATERNAL/CHILD NOTE  Patient Details  Name: Kim Carrillo MRN: [id number] Date of Birth: 04/21/2016  Date:  04/23/2015  Clinical Social Worker Initiating Note:  Lucita Ferrara MSW, LCSW Date/ Time Initiated:  04/23/15/1100     Child's Name:  Kim Carrillo   Legal Guardian:  Anson Fret and [place]  Need for Interpreter:  None   Date of Referral:  04/21/15     Reason for Referral:  Other (Comment)   Referral Source:  NICU   Address:  [address] [place], [place] [address]  Phone number:  [phone]   Household Members:  Minor Children (son and stepson), Significant Other   Natural Supports (not living in the home):  Immediate Family, Extended Family   Professional Supports: None   Employment: Full-time   Type of Work:     Education:      Pensions consultant:  [employer]   Other Resources:  [facility]   Cultural/Religious Considerations Which May Impact Care:  None reported  Strengths:  Ability to meet basic needs , Home prepared for child    Risk Factors/Current Problems:   1. Mental Health Concerns: MOB presents with a history of anxiety and depression since prior to 2008. MOB was prescribed Xanax, but discontinued medication with +UPT.  MOB reported increase in anxiety during the pregnancy, and reported feeling anxious secondary to the infant's NICU admission.     Cognitive State:  Able to Concentrate , Alert , Goal Oriented , Linear Thinking    Mood/Affect:  Euthymic , Sad, but appropriate for the setting and the situation   CSW Assessment:  CSW met with MOB in order to complete psychosocial assessment due to NICU admission and MOB presenting with a history of anxiety and depression.  Prior to meeting with MOB, CSW noted that MOB presents with a history of anxiety and depression since prior to 2008.  Per prenatal record, MOB was also prescribed Xanax until she learned that she was pregnant. During the pregnancy, MOB reported an  increase in anxiety, and expressed concerns for the health of her infant due to having friends and family members experiencing IUFD.    MOB and FOB presented as easily engaged and welcoming to [place] visit.  CSW introduced role of CSW in NICU care team, and they expressed appreciation for the visit.  MOB and FOB openly processed the events that led to the infant's admission to the NICU.  CSW normalized feelings of anxiety and worry during the childbirth process, and how they currently feel due to the infant being in the NICU.  The FOB shared that he has noted that he has been crying more frequently, and MOB reported that she feels comfortable crying and expressing her feelings to the FOB.  She stated that crying has been helpful for her.  MOB and FOB expressed confidence in the care team and the care that the infant is receiving.  MOB stated that she is anticipating her anxiety to increase once she is discharged from the hospital since the infant will not be able to be discharged home with her.  MOB recognized the infant's admission as short term, and expressed hope that it will be a brief admission.  MOB shared that she knows that if the infant was not in the NICU and home with her, she would be anxious and hypersensitive about the infant's health.  She shared that either outcome (infant at home or infant in the NICU), that she would feel  anxious, and she would rather have the infant in the NICU to ensure that the infant is healthy and ready for discharge.   MOB confirmed history of depression and anxiety.  She stated that she has a history of being prescribed Lexapro and Prozac, but was only prescribed Xanax prior to the pregnancy.  MOB stated that with the onset of the pregnancy, she discontinued her Xanax. Per MOB, she noted an increase in her anxiety during the pregnancy.  MOB stated that she felt like she was on edge, restless, and experienced heart palpitations.  Per MOB, she frequently forgets to breath,  and recognized how this causes her anxiety to worsen.  MOB stated that she "always gets through it", but she struggled to identify concrete behaviors or coping skills that assisted her to reduce anxiety.  MOB shared that she is contemplating re-starting medication now that she is not pregnant, but did not have concrete plans.   CSW provided education on the baby blues and perinatal mood disorders.  CSW highlighted MOB's risk factors, but also discussed difficulties differentiating situational thoughts and feelings due to the infant's NICU admission.  MOB repeated numerous times that she anticipates her anxiety to decrease once the infant has been discharged from the NICU, but she also recognized that there is a need to address her mental health concerns.  MOB denied need or desire to start medications at this time, and shared that she feels comfortable talking to her PCP (her previous prescribing provider) in the near future to re-evaluate for medications.     CSW Plan/Description:  Patient/Family Education , No Further Intervention Required/No Barriers to Discharge    Sharyl Nimrod 04/23/2015, 12:37 PM

## 2015-04-23 NOTE — Progress Notes (Signed)
Pt is discharged in the care of boy friend ,withN.T. Escort. Denies any pain or discomfort. Spirits are good Infant to remain in NICu. Discharge instructions with Rx were given to pt with good understanding Questions asked and answered. No equipment needed for home use.Denies any pain or discomfort. Stable

## 2015-04-24 ENCOUNTER — Ambulatory Visit: Payer: Self-pay

## 2015-04-24 ENCOUNTER — Encounter (HOSPITAL_COMMUNITY): Payer: Self-pay | Admitting: *Deleted

## 2015-04-24 ENCOUNTER — Inpatient Hospital Stay (HOSPITAL_COMMUNITY): Admission: RE | Admit: 2015-04-24 | Payer: Medicaid Other | Source: Ambulatory Visit

## 2015-04-24 NOTE — Lactation Note (Signed)
This note was copied from a baby's chart. Lactation Consultation Note  Visit with mom in the NICU.  Baby is at 60 hours.  Mom states she is pumping small amounts of colostrum.  She is hoping baby will start doing more breastfeeding today.  Discussed the importance of continued pumping even if baby goes to breast.  Reminded that baby may be inconsistent at breast.  Encouraged to call with concerns/assist.  Patient Name: [name] KGURK'YToday's Date: 04/24/2015     Maternal Data    Feeding Feeding Type: Formula Nipple Type: Slow - flow Length of feed: 30 min  LATCH Score/Interventions Latch: Too sleepy or reluctant, no latch achieved, no sucking elicited. Intervention(s): Teach feeding cues (informed mom that infant just finished eating at 1000.)                    Lactation Tools Discussed/Used     Consult Status      Huston FoleyMOULDEN, Harrison Zetina S 04/24/2015, 11:55 AM

## 2015-04-25 ENCOUNTER — Ambulatory Visit: Payer: Self-pay

## 2015-04-25 NOTE — Lactation Note (Signed)
This note was copied from a baby's chart. Lactation Consultation Note  Follow up visit with mom in the NICU.  She is pumping 10 mls of transitional milk.  Mom states she is exhausted and not pumping every 3 hours.  Stressed importance of regular pumping to establish a good milk supply.  Mom is putting baby to breast also.  Instructed to also post pump.  Patient Name: Girl Sharen HonesVera Housand ZOXWR'UToday's Date: 04/25/2015     Maternal Data    Feeding    LATCH Score/Interventions                      Lactation Tools Discussed/Used     Consult Status      Huston FoleyMOULDEN, Council Munguia S 04/25/2015, 12:00 PM

## 2015-04-27 ENCOUNTER — Ambulatory Visit: Payer: Self-pay

## 2015-04-27 NOTE — Lactation Note (Signed)
This note was copied from a baby's chart. Lactation Consultation Note  Mom and baby roomed in last night and plan to tonight also.  Baby is nursing frequently but also needs some formula supplementation due to supply not fully established.  Mom pumping infrequently and reports obtaining 30 mls last pumping.  Observed mom latch baby using cradle hold.  Baby latched only to nipple and mom uncomfortable.  Assisted with cross cradle hold, breast compression and bringing baby to breast quick;y after wide open mouth.  Baby latched easily and deeper.  Observed baby nurse actively with many audible swallows.  Mom states breasts are fuller today.  Instructed to post pump after every feed during the day to increase supply and offer milk back to baby.  Encouraged to call with concerns/assist prn.  Patient Name: Kim Carrillo'UToday's Date: 04/27/2015 Reason for consult: Follow-up assessment   Maternal Data    Feeding Feeding Type: Breast Fed Nipple Type: Slow - flow Length of feed: 20 min  LATCH Score/Interventions Latch: Grasps breast easily, tongue down, lips flanged, rhythmical sucking. Intervention(s): Teach feeding cues;Waking techniques Intervention(s): Breast compression;Breast massage;Assist with latch;Adjust position  Audible Swallowing: Spontaneous and intermittent  Type of Nipple: Everted at rest and after stimulation  Comfort (Breast/Nipple): Filling, red/small blisters or bruises, mild/mod discomfort  Problem noted: Mild/Moderate discomfort  Hold (Positioning): Assistance needed to correctly position infant at breast and maintain latch. Intervention(s): Breastfeeding basics reviewed;Support Pillows;Position options  LATCH Score: 8  Lactation Tools Discussed/Used     Consult Status      Huston FoleyMOULDEN, Aarini Slee S 04/27/2015, 2:04 PM

## 2015-06-04 ENCOUNTER — Ambulatory Visit: Payer: Medicaid Other | Admitting: Women's Health

## 2015-06-06 ENCOUNTER — Ambulatory Visit (INDEPENDENT_AMBULATORY_CARE_PROVIDER_SITE_OTHER): Payer: Medicaid Other | Admitting: Women's Health

## 2015-06-06 ENCOUNTER — Encounter: Payer: Self-pay | Admitting: Women's Health

## 2015-06-06 DIAGNOSIS — Z3202 Encounter for pregnancy test, result negative: Secondary | ICD-10-CM

## 2015-06-06 DIAGNOSIS — Z8632 Personal history of gestational diabetes: Secondary | ICD-10-CM

## 2015-06-06 DIAGNOSIS — O139 Gestational [pregnancy-induced] hypertension without significant proteinuria, unspecified trimester: Secondary | ICD-10-CM | POA: Insufficient documentation

## 2015-06-06 LAB — POCT URINE PREGNANCY: Preg Test, Ur: NEGATIVE

## 2015-06-06 NOTE — Progress Notes (Signed)
Patient ID: Kim AlmasVera Sharri Grzywacz, female   DOB: [birth year], 35 y.o.   MRN: [id number] Subjective:    Kim Carrillo is a 35 y.o. [id number] Caucasian female who presents for a postpartum visit. She is 6 weeks postpartum following a spontaneous vaginal delivery at 40.5 gestational weeks after IOL for GHTN. Anesthesia: epidural. I have fully reviewed the prenatal and intrapartum course. Developed Triple I. Baby went to NICU. Postpartum course has been uncomplicated. Baby's course has been complicated by 7d NICU stay d/t infection/needing IV antibiotics. Baby is feeding by breast. Bleeding no bleeding. Bowel function is normal. Bladder function is normal. Patient is sexually active. Last sexual activity: 5/18. Contraception method is none and desires tubes tied- discussed salpingectomy for benefits of reduced risk for ovarian cancer later in life- would like to do this. Postpartum depression screening: negative. Score 6.  [place]  The following portions of the patient's history were reviewed and updated as appropriate: allergies, current medications, past medical history, past surgical history and problem list.  Review of Systems Pertinent items are noted in HPI.   Filed Vitals:   06/06/15 1445  BP: 126/80  Pulse: 80  Weight: 138 lb (62.596 kg)   No LMP recorded.  Objective:   General:  alert, cooperative and no distress   Breasts:  deferred, no complaints  Lungs: clear to auscultation bilaterally  Heart:  regular rate and rhythm  Abdomen: soft, nontender   Vulva: normal  Vagina: normal vagina  Cervix:  closed  Corpus: Well-involuted  Adnexa:  Non-palpable  Rectal Exam: No hemorrhoids       UPT here today neg  Assessment:   Postpartum exam 6 wks s/p SVB after IOL for GHTN Breastfeeding Depression screening Contraception counseling   Plan:   Contraception: abstinence until salpingectomy Follow up in: 1 week for pre-op w/ JVF or earlier if  needed  Kim Carrillo, Kim Carrillo CNM, WHNP-BC 06/06/2015 2:50 PM

## 2015-06-06 NOTE — Patient Instructions (Signed)
Tips To Increase Milk Supply  Lots of water! Enough so that your urine is clear  Plenty of calories, if you're not getting enough calories, your milk supply can decrease  Breastfeed/pump often, every 2-3 hours x 20-4530mins  Fenugreek 3 pills 3 times a day, this may make your urine smell like maple syrup  Mother's Milk Tea  Lactation cookies, google for the recipe  Real oatmeal   Salpingectomy Salpingectomy, also called tubectomy, is the surgical removal of one of the fallopian tubes. The fallopian tubes are tubes that are connected to the uterus. These tubes transport the egg from the ovary to the uterus. A salpingectomy may be done for various reasons, including:   A tubal (ectopic) pregnancy. This is especially true if the tube ruptures.  An infected fallopian tube.  The need to remove the fallopian tube when removing an ovary with a cyst or tumor.  The need to remove the fallopian tube when removing the uterus.  Cancer of the fallopian tube or nearby organs. Removing one fallopian tube does not prevent you from becoming pregnant. It also does not cause problems with your menstrual periods.  LET [facility] PROVID[facility] KNOW ABOUT:  Any allergies you have.  All medicines you are taking, including vitamins, herbs, eye drops, creams, and over-the-counter medicines.  Previous problems you or members of your family have had with the use of anesthetics.  Any blood disorders you have.  Previous surgeries you have had.  Medical conditions you have. RISKS AND COMPLICATIONS  Generally, this is a safe procedure. However, as with any procedure, complications can occur. Possible complications include:  Injury to surrounding organs.  Bleeding.  Infection.  Problems related to anesthesia. BEFORE THE PROCEDURE  Ask your health care provider about changing or stopping your regular medicines. You may need to stop taking certain medicines, such as aspirin or blood thinners, at  least 1 week before the surgery.  Do not eat or drink anything for at least 8 hours before the surgery.  If you smoke, do not smoke for at least 2 weeks before the surgery.  Make plans to have someone drive you home after the procedure or after your hospital stay. Also arrange for someone to help you with activities during recovery. PROCEDURE   You will be given medicine to help you relax before the procedure (sedative). You will then be given medicine to make you sleep through the procedure (general anesthetic). These medicines will be given through an IV access tube that is put into one of your veins.  Once you are asleep, your lower abdomen will be shaved and cleaned. A thin, flexible tube (catheter) will be placed in your bladder.  The surgeon may use a laparoscopic, robotic, or open technique for this surgery:  In the laparoscopic technique, the surgery is done through two small cuts (incisions) in the abdomen. A thin, lighted tube with a tiny camera on the end (laparoscope) is inserted into one of the incisions. The tools needed for the procedure are put through the other incision.  A robotic technique may be chosen to perform complex surgery in a small space. In the robotic technique, small incisions will be made. A camera and surgical instruments are passed through the incisions. Surgical instruments will be controlled with the help of a robotic arm.  In the open technique, the surgery is done through one large incision in the abdomen.  Using any of these techniques, the surgeon removes the fallopian tube from where it  attaches to the uterus. The blood vessels will be clamped and tied.  The surgeon then uses staples or stitches to close the incision or incisions. AFTER THE PROCEDURE   You will be taken to a recovery area where your progress will be monitored for 1-3 hours.  If the laparoscopic technique was used, you may be allowed to go home after several hours. You may have some  shoulder pain after the laparoscopic procedure. This is normal and usually goes away in a day or two.  If the open technique was used, you will be admitted to the hospital for a couple of days.  You will be given pain medicine if needed.  The IV access tube and catheter will be removed before you are discharged.   This information is not intended to replace advice given to you by your health care provider. Make sure you discuss any questions you have with your health care provider.   Document Released: 05/18/2008 Document Revised: 01/20/2014 Document Reviewed: 06/23/2012 Elsevier Interactive Patient Education [employer]

## 2015-06-15 ENCOUNTER — Encounter: Payer: Self-pay | Admitting: Obstetrics and Gynecology

## 2015-06-15 ENCOUNTER — Ambulatory Visit (INDEPENDENT_AMBULATORY_CARE_PROVIDER_SITE_OTHER): Payer: Medicaid Other | Admitting: Obstetrics and Gynecology

## 2015-06-15 VITALS — BP 120/80 | Ht 61.0 in | Wt 134.0 lb

## 2015-06-15 DIAGNOSIS — Z3009 Encounter for other general counseling and advice on contraception: Secondary | ICD-10-CM | POA: Diagnosis not present

## 2015-06-15 DIAGNOSIS — Z302 Encounter for sterilization: Secondary | ICD-10-CM | POA: Insufficient documentation

## 2015-06-15 NOTE — Progress Notes (Signed)
Preoperative History and Physical  Kim CurdVera Tyrone SchimkeSharri Carrillo is a 35 y.o. [id number] here for surgical management of sterilization.   No significant preoperative concerns. Pt reports that she was seen in the office last week for her 6 week postpartum care. Denies any other symptoms. Pt reports that her periods would like 6-7 days and would be heavy for the first two days.   Proposed surgery: Bilateral tubal ligation  Past Medical History  Diagnosis Date  . Anxiety   . Migraine headache   . ANAL FISSURE, HX OF 11/23/2006    Qualifier: Diagnosis of  By: Genelle GatherShoffner CMA, [place]    . MIGRAINE HEADACHE 11/23/2006    Qualifier: Diagnosis of  By: Genelle GatherShoffner CMA, [place]    . Gestational diabetes     previous pregnancy  . Complication of anesthesia     didn't fall asleep during sigmoidoscopy  . ANXIETY DEPRESSION 11/23/2006    Qualifier: Diagnosis of  By: Genelle GatherShoffner CMA, [place]     Past Surgical History  Procedure Laterality Date  . Ovarian cyst surgery    . Dilation and curettage of uterus  2008    TAB X 1   OB History  Gravida Para Term Preterm AB SAB TAB Ectopic Multiple Living  3 2 2  0 1 0 1 0 0 2    # Outcome Date GA Lbr Len/2nd Weight Sex Delivery Anes PTL Lv  3 Term 04/21/15 3554w4d 05:00 / 00:24 7 lb 11.5 oz (3.5 kg) F Vag-Spont EPI  Y  2 TAB 05/19/06          1 Term 05/19/06 5138w0d  6 lb 9 oz (2.977 kg)  Vag-Spont   Y    Patient denies any other pertinent gynecologic issues.   Current Outpatient Prescriptions on File Prior to Visit  Medication Sig Dispense Refill  . acetaminophen (TYLENOL) 325 MG tablet Take 650 mg by mouth every 6 (six) hours as needed for headache.     . calcium carbonate (TUMS - DOSED IN MG ELEMENTAL CALCIUM) 500 MG chewable tablet Chew 1 tablet by mouth as needed for heartburn. Reported on 06/06/2015    . diphenhydrAMINE (BENADRYL) 12.5 MG/5ML liquid Take 12.5 mg by mouth 4 (four) times daily as needed for itching, allergies or sleep. Reported on 06/06/2015    . ibuprofen  (ADVIL,MOTRIN) 600 MG tablet Take 1 tablet (600 mg total) by mouth every 6 (six) hours. 30 tablet 0  . Pediatric Multiple Vit-C-FA (FLINSTONES GUMMIES OMEGA-3 DHA PO) Take 3 each by mouth daily.      No current facility-administered medications on file prior to visit.   No Known Allergies  Social History:   reports that she has quit smoking. Her smoking use included Cigarettes. She quit after 2 years of use. She has never used smokeless tobacco. She reports that she does not drink alcohol or use illicit drugs.  Family History  Problem Relation Age of Onset  . Hypertension Mother   . Hypertension Maternal Grandfather   . Heart attack Paternal Grandmother   . Gallstones Paternal Grandfather     Review of Systems: Noncontributory  PHYSICAL EXAM: Blood pressure 120/80, height 5\' 1"  (1.549 m), weight 134 lb (60.782 kg), currently breastfeeding.  Physical Examination: General appearance - alert, well appearing, and in no distress, oriented to person, place, and time and normal appearing weight Mental status - alert, oriented to person, place, and time, normal mood, behavior, speech, dress, motor activity, and thought processes Chest - clear to auscultation, no wheezes, rales  or rhonchi, symmetric air entry Heart - normal rate and regular rhythm Abdomen - soft, nontender, nondistended, no masses or organomegaly Pelvic - normal external genitalia, vulva, vagina, cervix, uterus and adnexa Extremities - peripheral pulses normal, no pedal edema, no clubbing or cyanosis  Discussed with pt risks and benefits of permanent sterilization and endometrial ablation. At end of discussion, pt had opportunity to ask questions and has no further questions at this time.   Greater than 50% was spent in counseling and coordination of care with the patient. Total time greater than: 15 minutes  Labs: Results for orders placed or performed in visit on 06/06/15 (from the past 336 hour(s))  POCT urine pregnancy    Collection Time: 06/06/15  3:39 PM  Result Value Ref Range   Preg Test, Ur Negative Negative    Imaging Studies: No results found.  Assessment: Patient Active Problem List   Diagnosis Date Noted  . h/o Gestational hypertension 06/06/2015  . History of gestational diabetes 01/23/2015  . Gallstones 05/07/2012  . Gastroenteritis 05/07/2012  . Cough 05/07/2012  . HEMORRHOIDS-EXTERNAL 11/09/2007  . RECTAL BLEEDING 11/09/2007  . ANXIETY DEPRESSION 11/23/2006  . MIGRAINE HEADACHE 11/23/2006  . OVARIAN CYST 11/23/2006  . ANAL FISSURE, HX OF 11/23/2006  . OVARIAN CYSTECTOMY, HX OF 11/23/2006    Plan: Patient will undergo surgical management with bilateral tubal ligation to be scheduled today in officeFor 16 June    06/15/2015 1:03 PM  I personally performed the services described in this documentation, which was SCRIBED in my presence. The recorded information has been reviewed and considered accurate. It has been edited as necessary during review. Tilda Burrow, MD     By signing my name below, I, [name], attest that this documentation has been prepared under the direction and in the presence of Tilda Burrow, MD. Electronically Signed: [name], ED Scribe. 06/15/2015. 1:03 PM.

## 2015-06-21 NOTE — Patient Instructions (Signed)
Kim Carrillo  06/21/2015     @PREFPERIOPPHARMACY @   Your procedure is scheduled on 06/29/2015.  [facility] to [facility] at [facility].M.  Call this number if you have problems the morning of surgery:  [phone]   Remember:  Do not eat food or drink liquids after midnight.  Take these medicines the morning of surgery with A SIP OF WATER : none   Do not wear jewelry, make-up or nail polish.  Do not wear lotions, powders, or perfumes.  You may wear deodorant.  Do not shave 48 hours prior to surgery.  Men may shave face and neck.  Do not bring valuables to the hospital.  [facility] is not responsible for any belongings or valuables.  Contacts, dentures or bridgework may not be worn into surgery.  Leave your suitcase in the car.  After surgery it may be brought to your room.  For patients admitted to the hospital, discharge time will be determined by your treatment team.  Patients discharged the day of surgery will not be allowed to drive home.   Name and phone number of your driver:   family Special instructions:  n/a  Please read over the following fact sheets that you were given. Anesthesia Post-op Instructions   General Anesthesia, Adult General anesthesia is a sleep-like state of non-feeling produced by medicines (anesthetics). General anesthesia prevents you from being alert and feeling pain during a medical procedure. Your caregiver may recommend general anesthesia if your procedure: Is long. Is painful or uncomfortable. Would be frightening to see or hear. Requires you to be still. Affects your breathing. Causes significant blood loss. LET YOUR CAREGIVER KNOW ABOUT: Allergies to food or medicine. Medicines taken, including vitamins, herbs, eyedrops, over-the-counter medicines, and creams. Use of steroids (by mouth or creams). Previous problems with anesthetics or numbing medicines, including problems experienced by relatives. History of bleeding problems or blood  clots. Previous surgeries and types of anesthetics received. Possibility of pregnancy, if this applies. Use of cigarettes, alcohol, or illegal drugs. Any health condition(s), especially diabetes, sleep apnea, and high blood pressure. RISKS AND COMPLICATIONS General anesthesia rarely causes complications. However, if complications do occur, they can be life threatening. Complications include: A lung infection. A stroke. A heart attack. Waking up during the procedure. When this occurs, the patient may be unable to move and communicate that he or she is awake. The patient may feel severe pain. Older adults and adults with serious medical problems are more likely to have complications than adults who are young and healthy. Some complications can be prevented by answering all of your caregiver's questions thoroughly and by following all pre-procedure instructions. It is important to tell your caregiver if any of the pre-procedure instructions, especially those related to diet, were not followed. Any food or liquid in the stomach can cause problems when you are under general anesthesia. BEFORE THE PROCEDURE Ask your caregiver if you will have to spend the night at the hospital. If you will not have to spend the night, arrange to have an adult drive you and stay with you for 24 hours. Follow your caregiver's instructions if you are taking dietary supplements or medicines. Your caregiver may tell you to stop taking them or to reduce your dosage. Do not smoke for as long as possible before your procedure. If possible, stop smoking 3-6 weeks before the procedure. Do not take new dietary supplements or medicines within 1 week of your procedure unless your caregiver approves them. Do  not eat within 8 hours of your procedure or as directed by your caregiver. Drink only clear liquids, such as water, black coffee (without milk or cream), and fruit juices (without pulp). Do not drink within 3 hours of your  procedure or as directed by your caregiver. You may brush your teeth on the morning of the procedure, but make sure to spit out the toothpaste and water when finished. PROCEDURE  You will receive anesthetics through a mask, through an intravenous (IV) access tube, or through both. A doctor who specializes in anesthesia (anesthesiologist) or a nurse who specializes in anesthesia (nurse anesthetist) or both will stay with you throughout the procedure to make sure you remain unconscious. He or she will also watch your blood pressure, pulse, and oxygen levels to make sure that the anesthetics do not cause any problems. Once you are asleep, a breathing tube or mask may be used to help you breathe. AFTER THE PROCEDURE You will wake up after the procedure is complete. You may be in the room where the procedure was performed or in a recovery area. You may have a sore throat if a breathing tube was used. You may also feel: Dizzy. Weak. Drowsy. Confused. Nauseous. Cold. These are all normal responses and can be expected to last for up to 24 hours after the procedure is complete. A caregiver will tell you when you are ready to go home. This will usually be when you are fully awake and in stable condition.   This information is not intended to replace advice given to you by your health care provider. Make sure you discuss any questions you have with your health care provider.   Document Released: 04/08/2007 Document Revised: 01/20/2014 Document Reviewed: 04/30/2011 Elsevier Interactive Patient Education 2016 [employer].       Salpingectomy Salpingectomy, also called tubectomy, is the surgical removal of one of the fallopian tubes. The fallopian tubes are tubes that are connected to the uterus. These tubes transport the egg from the ovary to the uterus. A salpingectomy may be done for various reasons, including:   A tubal (ectopic) pregnancy. This is especially true if the tube ruptures.  An  infected fallopian tube.  The need to remove the fallopian tube when removing an ovary with a cyst or tumor.  The need to remove the fallopian tube when removing the uterus.  Cancer of the fallopian tube or nearby organs. Removing one fallopian tube does not prevent you from becoming pregnant. It also does not cause problems with your menstrual periods.  LET [facility] CARE PROVIDER KNOW ABOUT:  Any allergies you have.  All medicines you are taking, including vitamins, herbs, eye drops, creams, and over-the-counter medicines.  Previous problems you or members of your family have had with the use of anesthetics.  Any blood disorders you have.  Previous surgeries you have had.  Medical conditions you have. RISKS AND COMPLICATIONS  Generally, this is a safe procedure. However, as with any procedure, complications can occur. Possible complications include:  Injury to surrounding organs.  Bleeding.  Infection.  Problems related to anesthesia. BEFORE THE PROCEDURE  Ask your health care provider about changing or stopping your regular medicines. You may need to stop taking certain medicines, such as aspirin or blood thinners, at least 1 week before the surgery.  Do not eat or drink anything for at least 8 hours before the surgery.  If you smoke, do not smoke for at least 2 weeks before the surgery.  Make plans to have someone drive you home after the procedure or after your hospital stay. Also arrange for someone to help you with activities during recovery. PROCEDURE   You will be given medicine to help you relax before the procedure (sedative). You will then be given medicine to make you sleep through the procedure (general anesthetic). These medicines will be given through an IV access tube that is put into one of your veins.  Once you are asleep, your lower abdomen will be shaved and cleaned. A thin, flexible tube (catheter) will be placed in your bladder.  The surgeon may  use a laparoscopic, robotic, or open technique for this surgery:  In the laparoscopic technique, the surgery is done through two small cuts (incisions) in the abdomen. A thin, lighted tube with a tiny camera on the end (laparoscope) is inserted into one of the incisions. The tools needed for the procedure are put through the other incision.  A robotic technique may be chosen to perform complex surgery in a small space. In the robotic technique, small incisions will be made. A camera and surgical instruments are passed through the incisions. Surgical instruments will be controlled with the help of a robotic arm.  In the open technique, the surgery is done through one large incision in the abdomen.  Using any of these techniques, the surgeon removes the fallopian tube from where it attaches to the uterus. The blood vessels will be clamped and tied.  The surgeon then uses staples or stitches to close the incision or incisions. AFTER THE PROCEDURE   You will be taken to a recovery area where your progress will be monitored for 1-3 hours.  If the laparoscopic technique was used, you may be allowed to go home after several hours. You may have some shoulder pain after the laparoscopic procedure. This is normal and usually goes away in a day or two.  If the open technique was used, you will be admitted to the hospital for a couple of days.  You will be given pain medicine if needed.  The IV access tube and catheter will be removed before you are discharged.   This information is not intended to replace advice given to you by your health care provider. Make sure you discuss any questions you have with your health care provider.   Document Released: 05/18/2008 Document Revised: 01/20/2014 Document Reviewed: 06/23/2012 Elsevier Interactive Patient Education [employer]2016 Elsevier Inc.

## 2015-06-21 NOTE — Patient Instructions (Signed)
    Kim CooleyVera S Carrillo  06/21/2015      [id number] - [place], [place] - [address] [place] [id number] Phone: [phone] Fax: [phone]    Your procedure is scheduled on 06/29/2015.  [facility]  Call this number if you have problems the morning of surgery:  [phone]   Remember:  Do not eat food or drink liquids after midnight.  Take these medicines the morning of surgery with A SIP OF WATER   Do not wear jewelry, make-up or nail polish.  Do not wear lotions, powders, or perfumes.  You may wear deodorant.  Do not shave 48 hours prior to surgery.  Men may shave face and neck.  Do not bring valuables to the hospital.  [facility] is not responsible for any belongings or valuables.  Contacts, dentures or bridgework may not be worn into surgery.  Leave your suitcase in the car.  After surgery it may be brought to your room.  For patients admitted to the hospital, discharge time will be determined by your treatment team.  Patients discharged the day of surgery will not be allowed to drive home.   Name and phone number of your driver:   family Special instructions:  n/a  Please read over the following fact sheets that you were given. Care and Recovery After Surgery

## 2015-06-22 ENCOUNTER — Other Ambulatory Visit: Payer: Self-pay | Admitting: Obstetrics and Gynecology

## 2015-06-22 ENCOUNTER — Encounter (HOSPITAL_COMMUNITY)
Admission: RE | Admit: 2015-06-22 | Discharge: 2015-06-22 | Disposition: A | Discharge status: Home / Self Care | Payer: Medicaid Other | Source: Ambulatory Visit | Attending: Obstetrics and Gynecology | Admitting: Obstetrics and Gynecology

## 2015-06-22 ENCOUNTER — Encounter (HOSPITAL_COMMUNITY): Payer: Self-pay

## 2015-06-22 ENCOUNTER — Telehealth: Payer: Self-pay | Admitting: Obstetrics and Gynecology

## 2015-06-22 DIAGNOSIS — Z01812 Encounter for preprocedural laboratory examination: Secondary | ICD-10-CM | POA: Insufficient documentation

## 2015-06-22 DIAGNOSIS — Z9889 Other specified postprocedural states: Secondary | ICD-10-CM | POA: Insufficient documentation

## 2015-06-22 DIAGNOSIS — Z87891 Personal history of nicotine dependence: Secondary | ICD-10-CM | POA: Insufficient documentation

## 2015-06-22 DIAGNOSIS — Z8632 Personal history of gestational diabetes: Secondary | ICD-10-CM | POA: Insufficient documentation

## 2015-06-22 DIAGNOSIS — F419 Anxiety disorder, unspecified: Secondary | ICD-10-CM | POA: Insufficient documentation

## 2015-06-22 DIAGNOSIS — Z8249 Family history of ischemic heart disease and other diseases of the circulatory system: Secondary | ICD-10-CM | POA: Diagnosis not present

## 2015-06-22 DIAGNOSIS — Z79899 Other long term (current) drug therapy: Secondary | ICD-10-CM | POA: Insufficient documentation

## 2015-06-22 LAB — COMPREHENSIVE METABOLIC PANEL
ALBUMIN: 4.3 g/dL (ref 3.5–5.0)
ALT: 29 U/L (ref 14–54)
AST: 27 U/L (ref 15–41)
Alkaline Phosphatase: 127 U/L — ABNORMAL HIGH (ref 38–126)
Anion gap: 9 (ref 5–15)
BUN: 14 mg/dL (ref 6–20)
CHLORIDE: 102 mmol/L (ref 101–111)
CO2: 27 mmol/L (ref 22–32)
Calcium: 9 mg/dL (ref 8.9–10.3)
Creatinine, Ser: 0.61 mg/dL (ref 0.44–1.00)
GFR calc Af Amer: >60 mL/min (ref >60)
GFR calc non Af Amer: >60 mL/min (ref >60)
GLUCOSE: 69 mg/dL (ref 65–99)
POTASSIUM: 4.1 mmol/L (ref 3.5–5.1)
Sodium: 138 mmol/L (ref 135–145)
Total Bilirubin: 0.5 mg/dL (ref 0.3–1.2)
Total Protein: 7.1 g/dL (ref 6.5–8.1)

## 2015-06-22 LAB — URINALYSIS, ROUTINE W REFLEX MICROSCOPIC
Bilirubin Urine: NEGATIVE
GLUCOSE, UA: NEGATIVE mg/dL
Hgb urine dipstick: NEGATIVE
Ketones, ur: NEGATIVE mg/dL
LEUKOCYTES UA: NEGATIVE
Nitrite: NEGATIVE
PH: 5.5 (ref 5.0–8.0)
Protein, ur: NEGATIVE mg/dL
Specific Gravity, Urine: 1.02 (ref 1.005–1.030)

## 2015-06-22 LAB — CBC
HEMATOCRIT: 41.9 % (ref 36.0–46.0)
Hemoglobin: 13.7 g/dL (ref 12.0–15.0)
MCH: 29.2 pg (ref 26.0–34.0)
MCHC: 32.7 g/dL (ref 30.0–36.0)
MCV: 89.3 fL (ref 78.0–100.0)
PLATELETS: 319 10*3/uL (ref 150–400)
RBC: 4.69 MIL/uL (ref 3.87–5.11)
RDW: 17.3 % — AB (ref 11.5–15.5)
WBC: 8.1 10*3/uL (ref 4.0–10.5)

## 2015-06-22 LAB — HCG, SERUM, QUALITATIVE: Preg, Serum: NEGATIVE

## 2015-06-22 NOTE — H&P (Signed)
Expand All Collapse All   Preoperative History and Physical  Kim CurdVera Tyrone SchimkeSharri Carrillo is a 35 y.o. [id number] here for surgical management of sterilization.  No significant preoperative concerns. Pt reports that she was seen in the office last week for her 6 week postpartum care. Denies any other symptoms. Pt reports that her periods would like 6-7 days and would be heavy for the first two days.   Proposed surgery: Bilateral tubal ligation  Past Medical History  Diagnosis Date  . Anxiety   . Migraine headache   . ANAL FISSURE, HX OF 11/23/2006    Qualifier: Diagnosis of By: Genelle GatherShoffner CMA, [place]   . MIGRAINE HEADACHE 11/23/2006    Qualifier: Diagnosis of By: Genelle GatherShoffner CMA, [place]   . Gestational diabetes     previous pregnancy  . Complication of anesthesia     didn't fall asleep during sigmoidoscopy  . ANXIETY DEPRESSION 11/23/2006    Qualifier: Diagnosis of By: Genelle GatherShoffner CMA, [place]    Past Surgical History  Procedure Laterality Date  . Ovarian cyst surgery    . Dilation and curettage of uterus  2008    TAB X 1   OB History  Gravida Para Term Preterm AB SAB TAB Ectopic Multiple Living  3 2 2  0 1 0 1 0 0 2    # Outcome Date GA Lbr Len/2nd Weight Sex Delivery Anes PTL Lv  3 Term 04/21/15 8862w4d 05:00 / 00:24 7 lb 11.5 oz (3.5 kg) F Vag-Spont EPI  Y  2 TAB 05/19/06          1 Term 05/19/06 5571w0d  6 lb 9 oz (2.977 kg)  Vag-Spont   Y    Patient denies any other pertinent gynecologic issues.   Current Outpatient Prescriptions on File Prior to Visit  Medication Sig Dispense Refill  . acetaminophen (TYLENOL) 325 MG tablet Take 650 mg by mouth every 6 (six) hours as needed for headache.     . calcium carbonate (TUMS - DOSED IN MG ELEMENTAL CALCIUM) 500 MG chewable tablet Chew 1 tablet by mouth as needed for heartburn. Reported on 06/06/2015    .  diphenhydrAMINE (BENADRYL) 12.5 MG/5ML liquid Take 12.5 mg by mouth 4 (four) times daily as needed for itching, allergies or sleep. Reported on 06/06/2015    . ibuprofen (ADVIL,MOTRIN) 600 MG tablet Take 1 tablet (600 mg total) by mouth every 6 (six) hours. 30 tablet 0  . Pediatric Multiple Vit-C-FA (FLINSTONES GUMMIES OMEGA-3 DHA PO) Take 3 each by mouth daily.      No current facility-administered medications on file prior to visit.   No Known Allergies  Social History:  reports that she has quit smoking. Her smoking use included Cigarettes. She quit after 2 years of use. She has never used smokeless tobacco. She reports that she does not drink alcohol or use illicit drugs.  Family History  Problem Relation Age of Onset  . Hypertension Mother   . Hypertension Maternal Grandfather   . Heart attack Paternal Grandmother   . Gallstones Paternal Grandfather     Review of Systems: Noncontributory  PHYSICAL EXAM: Blood pressure 120/80, height 5\' 1"  (1.549 m), weight 134 lb (60.782 kg), currently breastfeeding.  Physical Examination: General appearance - alert, well appearing, and in no distress, oriented to person, place, and time and normal appearing weight Mental status - alert, oriented to person, place, and time, normal mood, behavior, speech, dress, motor activity, and thought processes Chest - clear to auscultation, no wheezes, rales or  rhonchi, symmetric air entry Heart - normal rate and regular rhythm Abdomen - soft, nontender, nondistended, no masses or organomegaly Pelvic - normal external genitalia, vulva, vagina, cervix, uterus and adnexa Extremities - peripheral pulses normal, no pedal edema, no clubbing or cyanosis  Discussed with pt risks and benefits of permanent sterilization and endometrial ablation. At end of discussion, pt had opportunity to ask questions and has no further questions at this time.   Greater than 50% was spent in  counseling and coordination of care with the patient. Total time greater than: 15 minutes  Labs: Results for orders placed or performed in visit on 06/06/15 (from the past 336 hour(s))  POCT urine pregnancy   Collection Time: 06/06/15 3:39 PM  Result Value Ref Range   Preg Test, Ur Negative Negative    Imaging Studies:  Imaging Results    No results found.    Assessment: Patient Active Problem List   Diagnosis Date Noted  . h/o Gestational hypertension 06/06/2015  . History of gestational diabetes 01/23/2015  . Gallstones 05/07/2012  . Gastroenteritis 05/07/2012  . Cough 05/07/2012  . HEMORRHOIDS-EXTERNAL 11/09/2007  . RECTAL BLEEDING 11/09/2007  . ANXIETY DEPRESSION 11/23/2006  . MIGRAINE HEADACHE 11/23/2006  . OVARIAN CYST 11/23/2006  . ANAL FISSURE, HX OF 11/23/2006  . OVARIAN CYSTECTOMY, HX OF 11/23/2006    Plan: Patient will undergo surgical management with bilateral tubal ligation to be scheduled For 16 June at 7;30 am    06/15/2015 1:03 PM  I personally performed the services described in this documentation, which was SCRIBED in my presence. The recorded information has been reviewed and considered accurate. It has been edited as necessary during review. Tilda Burrow, MD    By signing my name below, I, [name], attest that this documentation has been prepared under the direction and in the presence of Tilda Burrow, MD. Electronically Signed: [name], ED Scribe. 06/15/2015. 1:03 PM.

## 2015-06-22 NOTE — Telephone Encounter (Signed)
Pt confirms that she  Only wants sterilization. She is not interested in endometrial ablation.

## 2015-06-29 ENCOUNTER — Ambulatory Visit (HOSPITAL_COMMUNITY): Payer: Medicaid Other | Admitting: Anesthesiology

## 2015-06-29 ENCOUNTER — Encounter (HOSPITAL_COMMUNITY)
Admission: RE | Disposition: A | Discharge status: Home / Self Care | Payer: Self-pay | Source: Ambulatory Visit | Attending: Obstetrics and Gynecology

## 2015-06-29 ENCOUNTER — Ambulatory Visit (HOSPITAL_COMMUNITY)
Admission: RE | Admit: 2015-06-29 | Discharge: 2015-06-29 | Disposition: A | Discharge status: Home / Self Care | Payer: Medicaid Other | Source: Ambulatory Visit | Attending: Obstetrics and Gynecology | Admitting: Obstetrics and Gynecology

## 2015-06-29 ENCOUNTER — Encounter (HOSPITAL_COMMUNITY): Payer: Self-pay | Admitting: *Deleted

## 2015-06-29 DIAGNOSIS — Z302 Encounter for sterilization: Secondary | ICD-10-CM | POA: Diagnosis present

## 2015-06-29 DIAGNOSIS — Z87891 Personal history of nicotine dependence: Secondary | ICD-10-CM | POA: Insufficient documentation

## 2015-06-29 DIAGNOSIS — Z8632 Personal history of gestational diabetes: Secondary | ICD-10-CM | POA: Diagnosis not present

## 2015-06-29 HISTORY — PX: LAPAROSCOPIC BILATERAL SALPINGECTOMY: SHX5889

## 2015-06-29 SURGERY — SALPINGECTOMY, BILATERAL, LAPAROSCOPIC
Anesthesia: General | Laterality: Bilateral

## 2015-06-29 MED ORDER — NEOSTIGMINE METHYLSULFATE 10 MG/10ML IV SOLN
INTRAVENOUS | Status: AC
  Filled 2015-06-29: qty 1

## 2015-06-29 MED ORDER — ROCURONIUM BROMIDE 50 MG/5ML IV SOLN
INTRAVENOUS | Status: AC
  Filled 2015-06-29: qty 1

## 2015-06-29 MED ORDER — ROCURONIUM BROMIDE 100 MG/10ML IV SOLN
INTRAVENOUS | Status: DC | PRN
  Administered 2015-06-29: 5 mg via INTRAVENOUS
  Administered 2015-06-29: 30 mg via INTRAVENOUS

## 2015-06-29 MED ORDER — MIDAZOLAM HCL 2 MG/2ML IJ SOLN
INTRAMUSCULAR | Status: AC
  Filled 2015-06-29: qty 2

## 2015-06-29 MED ORDER — FENTANYL CITRATE (PF) 100 MCG/2ML IJ SOLN: 25.0000 ug | INTRAMUSCULAR | Status: DC | PRN

## 2015-06-29 MED ORDER — PROPOFOL 10 MG/ML IV BOLUS
INTRAVENOUS | Status: DC | PRN
  Administered 2015-06-29: 160 mg via INTRAVENOUS

## 2015-06-29 MED ORDER — NEOSTIGMINE METHYLSULFATE 10 MG/10ML IV SOLN
INTRAVENOUS | Status: DC | PRN
  Administered 2015-06-29 (×2): 2 mg via INTRAVENOUS

## 2015-06-29 MED ORDER — GLYCOPYRROLATE 0.2 MG/ML IJ SOLN
INTRAMUSCULAR | Status: AC
  Filled 2015-06-29: qty 3

## 2015-06-29 MED ORDER — FENTANYL CITRATE (PF) 250 MCG/5ML IJ SOLN
INTRAMUSCULAR | Status: AC
  Filled 2015-06-29: qty 5

## 2015-06-29 MED ORDER — LACTATED RINGERS IV SOLN
INTRAVENOUS | Status: DC
  Administered 2015-06-29 (×2): via INTRAVENOUS

## 2015-06-29 MED ORDER — MIDAZOLAM HCL 5 MG/5ML IJ SOLN
INTRAMUSCULAR | Status: DC | PRN
  Administered 2015-06-29: 2 mg via INTRAVENOUS

## 2015-06-29 MED ORDER — MIDAZOLAM HCL 2 MG/2ML IJ SOLN
1.0000 mg | INTRAMUSCULAR | Status: DC | PRN
Start: 2015-06-29 — End: 2015-06-29
  Administered 2015-06-29: 2 mg via INTRAVENOUS
  Filled 2015-06-29: qty 2

## 2015-06-29 MED ORDER — FENTANYL CITRATE (PF) 100 MCG/2ML IJ SOLN
INTRAMUSCULAR | Status: DC | PRN
  Administered 2015-06-29 (×2): 50 ug via INTRAVENOUS
  Administered 2015-06-29: 100 ug via INTRAVENOUS

## 2015-06-29 MED ORDER — LIDOCAINE HCL 1 % IJ SOLN
INTRAMUSCULAR | Status: DC | PRN
  Administered 2015-06-29: 25 mg via INTRADERMAL

## 2015-06-29 MED ORDER — HYDROCODONE-ACETAMINOPHEN 5-325 MG PO TABS: 1.0000 | ORAL_TABLET | Freq: Four times a day (QID) | ORAL | Status: DC | PRN

## 2015-06-29 MED ORDER — PROPOFOL 10 MG/ML IV BOLUS
INTRAVENOUS | Status: AC
  Filled 2015-06-29: qty 20

## 2015-06-29 MED ORDER — 0.9 % SODIUM CHLORIDE (POUR BTL) OPTIME
TOPICAL | Status: DC | PRN
  Administered 2015-06-29: 1000 mL

## 2015-06-29 MED ORDER — ONDANSETRON HCL 4 MG/2ML IJ SOLN
4.0000 mg | Freq: Once | INTRAMUSCULAR | Status: AC
  Administered 2015-06-29: 4 mg via INTRAVENOUS
  Filled 2015-06-29: qty 2

## 2015-06-29 MED ORDER — GLYCOPYRROLATE 0.2 MG/ML IJ SOLN
INTRAMUSCULAR | Status: DC | PRN
  Administered 2015-06-29: 0.6 mg via INTRAVENOUS

## 2015-06-29 MED ORDER — ONDANSETRON HCL 4 MG/2ML IJ SOLN: 4.0000 mg | Freq: Once | INTRAMUSCULAR | Status: DC | PRN

## 2015-06-29 MED ORDER — LIDOCAINE HCL (PF) 1 % IJ SOLN
INTRAMUSCULAR | Status: AC
  Filled 2015-06-29: qty 5

## 2015-06-29 MED ORDER — BUPIVACAINE HCL (PF) 0.5 % IJ SOLN
INTRAMUSCULAR | Status: AC
  Filled 2015-06-29: qty 30

## 2015-06-29 SURGICAL SUPPLY — 41 items
BAG HAMPER (MISCELLANEOUS) ×3 IMPLANT
BANDAGE STRIP 1X3 FLEXIBLE (GAUZE/BANDAGES/DRESSINGS) ×9 IMPLANT
BANDAID FLEXIBLE 1X3 (GAUZE/BANDAGES/DRESSINGS) ×9 IMPLANT
BLADE SURG SZ11 CARB STEEL (BLADE) ×3 IMPLANT
CLOSURE STERI-STRIP 1/4X4 (GAUZE/BANDAGES/DRESSINGS) ×3 IMPLANT
CLOSURE WOUND 1/4 X3 (GAUZE/BANDAGES/DRESSINGS) ×1
CLOTH BEACON ORANGE TIMEOUT ST (SAFETY) ×3 IMPLANT
COVER LIGHT HANDLE STERIS (MISCELLANEOUS) ×6 IMPLANT
DECANTER SPIKE VIAL GLASS SM (MISCELLANEOUS) ×3 IMPLANT
DURAPREP 26ML APPLICATOR (WOUND CARE) ×3 IMPLANT
ELECT REM PT RETURN 9FT ADLT (ELECTROSURGICAL) ×3
ELECTRODE REM PT RTRN 9FT ADLT (ELECTROSURGICAL) ×1 IMPLANT
FORMALIN 10 PREFIL 120ML (MISCELLANEOUS) ×3 IMPLANT
GLOVE BIOGEL PI IND STRL 7.0 (GLOVE) ×1 IMPLANT
GLOVE BIOGEL PI IND STRL 9 (GLOVE) ×1 IMPLANT
GLOVE BIOGEL PI INDICATOR 7.0 (GLOVE) ×2
GLOVE BIOGEL PI INDICATOR 9 (GLOVE) ×2
GLOVE ECLIPSE 6.5 STRL STRAW (GLOVE) ×3 IMPLANT
GLOVE ECLIPSE 9.0 STRL (GLOVE) ×6 IMPLANT
GLOVE INDICATOR 7.0 STRL GRN (GLOVE) ×6 IMPLANT
GOWN SPEC L3 XXLG W/TWL (GOWN DISPOSABLE) ×3 IMPLANT
GOWN STRL REUS W/TWL LRG LVL3 (GOWN DISPOSABLE) ×3 IMPLANT
INST SET LAPROSCOPIC GYN AP (KITS) ×3 IMPLANT
KIT ROOM TURNOVER APOR (KITS) ×3 IMPLANT
NEEDLE INSUFFLATION 120MM (ENDOMECHANICALS) ×3 IMPLANT
NS IRRIG 1000ML POUR BTL (IV SOLUTION) ×3 IMPLANT
PACK PERI GYN (CUSTOM PROCEDURE TRAY) ×3 IMPLANT
PAD ARMBOARD 7.5X6 YLW CONV (MISCELLANEOUS) ×3 IMPLANT
SET BASIN LINEN APH (SET/KITS/TRAYS/PACK) ×3 IMPLANT
SET IRRIG TUBING LAPAROSCOPIC (IRRIGATION / IRRIGATOR) IMPLANT
SHEARS HARMONIC ACE PLUS 36CM (ENDOMECHANICALS) ×3 IMPLANT
SLEEVE ENDOPATH XCEL 5M (ENDOMECHANICALS) ×6 IMPLANT
SOLUTION ANTI FOG 6CC (MISCELLANEOUS) ×3 IMPLANT
STRIP CLOSURE SKIN 1/4X3 (GAUZE/BANDAGES/DRESSINGS) ×2 IMPLANT
SUT VIC AB 4-0 PS2 27 (SUTURE) ×3 IMPLANT
SYR 30ML LL (SYRINGE) ×3 IMPLANT
SYR BULB IRRIGATION 50ML (SYRINGE) ×3 IMPLANT
SYRINGE 10CC LL (SYRINGE) ×3 IMPLANT
TROCAR XCEL NON-BLD 5MMX100MML (ENDOMECHANICALS) ×3 IMPLANT
TUBING INSUFFLATION (TUBING) ×3 IMPLANT
WARMER LAPAROSCOPE (MISCELLANEOUS) ×3 IMPLANT

## 2015-06-29 NOTE — H&P (View-Only) (Signed)
Expand All Collapse All   Preoperative History and Physical  Kim Carrillo is a 35 y.o. [id number] here for surgical management of sterilization.  No significant preoperative concerns. Pt reports that she was seen in the office last week for her 6 week postpartum care. Denies any other symptoms. Pt reports that her periods would like 6-7 days and would be heavy for the first two days.   Proposed surgery: Bilateral tubal ligation  Past Medical History  Diagnosis Date  . Anxiety   . Migraine headache   . ANAL FISSURE, HX OF 11/23/2006    Qualifier: Diagnosis of By: Genelle GatherShoffner CMA, [place]   . MIGRAINE HEADACHE 11/23/2006    Qualifier: Diagnosis of By: Genelle GatherShoffner CMA, [place]   . Gestational diabetes     previous pregnancy  . Complication of anesthesia     didn't fall asleep during sigmoidoscopy  . ANXIETY DEPRESSION 11/23/2006    Qualifier: Diagnosis of By: Genelle GatherShoffner CMA, [place]    Past Surgical History  Procedure Laterality Date  . Ovarian cyst surgery    . Dilation and curettage of uterus  2008    TAB X 1   OB History  Gravida Para Term Preterm AB SAB TAB Ectopic Multiple Living  3 2 2  0 1 0 1 0 0 2    # Outcome Date GA Lbr Len/2nd Weight Sex Delivery Anes PTL Lv  3 Term 04/21/15 [id number] 05:00 / 00:24 7 lb 11.5 oz (3.5 kg) F Vag-Spont EPI  Y  2 TAB 05/19/06          1 Term 05/19/06 [id number]  6 lb 9 oz (2.977 kg)  Vag-Spont   Y    Patient denies any other pertinent gynecologic issues.   Current Outpatient Prescriptions on File Prior to Visit  Medication Sig Dispense Refill  . acetaminophen (TYLENOL) 325 MG tablet Take 650 mg by mouth every 6 (six) hours as needed for headache.     . calcium carbonate (TUMS - DOSED IN MG ELEMENTAL CALCIUM) 500 MG chewable tablet Chew 1 tablet by mouth as needed for heartburn. Reported on 06/06/2015    .  diphenhydrAMINE (BENADRYL) 12.5 MG/5ML liquid Take 12.5 mg by mouth 4 (four) times daily as needed for itching, allergies or sleep. Reported on 06/06/2015    . ibuprofen (ADVIL,MOTRIN) 600 MG tablet Take 1 tablet (600 mg total) by mouth every 6 (six) hours. 30 tablet 0  . Pediatric Multiple Vit-C-FA (FLINSTONES GUMMIES OMEGA-3 DHA PO) Take 3 each by mouth daily.      No current facility-administered medications on file prior to visit.   No Known Allergies  Social History:  reports that she has quit smoking. Her smoking use included Cigarettes. She quit after 2 years of use. She has never used smokeless tobacco. She reports that she does not drink alcohol or use illicit drugs.  Family History  Problem Relation Age of Onset  . Hypertension Mother   . Hypertension Maternal Grandfather   . Heart attack Paternal Grandmother   . Gallstones Paternal Grandfather     Review of Systems: Noncontributory  PHYSICAL EXAM: Blood pressure 120/80, height 5\' 1"  (1.549 m), weight 134 lb (60.782 kg), currently breastfeeding.  Physical Examination: General appearance - alert, well appearing, and in no distress, oriented to person, place, and time and normal appearing weight Mental status - alert, oriented to person, place, and time, normal mood, behavior, speech, dress, motor activity, and thought processes Chest - clear to auscultation, no wheezes, rales or  rhonchi, symmetric air entry Heart - normal rate and regular rhythm Abdomen - soft, nontender, nondistended, no masses or organomegaly Pelvic - normal external genitalia, vulva, vagina, cervix, uterus and adnexa Extremities - peripheral pulses normal, no pedal edema, no clubbing or cyanosis  Discussed with pt risks and benefits of permanent sterilization and endometrial ablation. At end of discussion, pt had opportunity to ask questions and has no further questions at this time.   Greater than 50% was spent in  counseling and coordination of care with the patient. Total time greater than: 15 minutes  Labs: Results for orders placed or performed in visit on 06/06/15 (from the past 336 hour(s))  POCT urine pregnancy   Collection Time: 06/06/15 3:39 PM  Result Value Ref Range   Preg Test, Ur Negative Negative    Imaging Studies:  Imaging Results    No results found.    Assessment: Patient Active Problem List   Diagnosis Date Noted  . h/o Gestational hypertension 06/06/2015  . History of gestational diabetes 01/23/2015  . Gallstones 05/07/2012  . Gastroenteritis 05/07/2012  . Cough 05/07/2012  . HEMORRHOIDS-EXTERNAL 11/09/2007  . RECTAL BLEEDING 11/09/2007  . ANXIETY DEPRESSION 11/23/2006  . MIGRAINE HEADACHE 11/23/2006  . OVARIAN CYST 11/23/2006  . ANAL FISSURE, HX OF 11/23/2006  . OVARIAN CYSTECTOMY, HX OF 11/23/2006    Plan: Patient will undergo surgical management with bilateral tubal ligation to be scheduled For 16 June at 7;30 am    06/15/2015 1:03 PM  I personally performed the services described in this documentation, which was SCRIBED in my presence. The recorded information has been reviewed and considered accurate. It has been edited as necessary during review. [name], MD    By signing my name below, I, [name], attest that this documentation has been prepared under the direction and in the presence of [name], MD. Electronically Signed: [name], ED Scribe. 06/15/2015. 1:03 PM.          

## 2015-06-29 NOTE — Discharge Instructions (Signed)
Laparoscopic Tubal Ligation, Care After Refer to this sheet in the next few weeks. These instructions provide you with information about caring for yourself after your procedure. Your health care provider may also give you more specific instructions. Your treatment has been planned according to current medical practices, but problems sometimes occur. Call your health care provider if you have any problems or questions after your procedure. WHAT TO EXPECT AFTER THE PROCEDURE After your procedure, it is common to have:  Sore throat.  Soreness at the incision site.  Mild cramping.  Tiredness.  Mild nausea or vomiting.  Shoulder pain. HOME CARE INSTRUCTIONS  Rest for the remainder of the day.  Take medicines only as directed by your health care provider. These include over-the-counter medicines and prescription medicines. Do not take aspirin, which can cause bleeding.  Over the next few days, gradually return to your normal activities and your normal diet.  Avoid sexual intercourse for 2 weeks or as directed by your health care provider.  Do not use tampons, and do not douche.  Do not drive or operate heavy machinery while taking pain medicine.  Do not lift anything that is heavier than 5 lb (2.3 kg) for 2 weeks or as directed by your health care provider.  Do not take baths. Take showers only. Ask your health care provider when you can start taking baths.  Take your temperature twice each day and write it down.  Try to have help for your household needs for the first 7-10 days.  There are many different ways to close and cover an incision, including stitches (sutures), skin glue, and adhesive strips. Follow instructions from your health care provider about:  Incision care.  Bandage (dressing) changes and removal.  Incision closure removal.  Check your incision area every day for signs of infection. Watch for:  Redness, swelling, or pain.  Fluid, blood, or pus.  Keep  all follow-up visits as directed by your health care provider. SEEK MEDICAL CARE IF:  You have redness, swelling, or increasing pain in your incision area.  You have fluid, blood, or pus coming from your incision for longer than 1 day.  You notice a bad smell coming from your incision or your dressing.  The edges of your incision break open after the sutures have been removed.  Your pain does not decrease after 2-3 days.  You have a rash.  You repeatedly become dizzy or light-headed.  You have a reaction to your medicine.  Your pain medicine is not helping.  You are constipated. SEEK IMMEDIATE MEDICAL CARE IF:  You have a fever.  You faint.  You have increasing pain in your abdomen.  You have severe pain in one or both of your shoulders.  You have bleeding or drainage from your suture sites or your vagina after surgery.  You have shortness of breath or have difficulty breathing.  You have chest pain or leg pain.  You have ongoing nausea, vomiting, or diarrhea.   This information is not intended to replace advice given to you by your health care provider. Make sure you discuss any questions you have with your health care provider.   Document Released: 07/19/2004 Document Revised: 05/16/2014 Document Reviewed: 04/12/2011 Elsevier Interactive Patient Education 2016 [employer]   PATIENT INSTRUCTIONS POST-ANESTHESIA  IMMEDIATELY FOLLOWING SURGERY:  Do not drive or operate machinery for the first twenty four hours after surgery.  Do not make any important decisions for twenty four hours after surgery or while taking narcotic  medications or sedatives.  If you develop intractable nausea and vomiting or a severe headache please notify your doctor immediately. ° °FOLLOW-UP:  Please make an appointment with your surgeon as instructed. You do not need to follow up with anesthesia unless specifically instructed to do so. ° °WOUND CARE INSTRUCTIONS (if applicable):  Keep a  dry clean dressing on the anesthesia/puncture wound site if there is drainage.  Once the wound has quit draining you may leave it open to air.  Generally you should leave the bandage intact for twenty four hours unless there is drainage.  If the epidural site drains for more than 36-48 hours please call the anesthesia department. ° °QUESTIONS?:  Please feel free to call your physician or the hospital operator if you have any questions, and they will be happy to assist you.    ° ° ° °

## 2015-06-29 NOTE — Anesthesia Preprocedure Evaluation (Signed)
Anesthesia Evaluation  Patient identified by MRN, date of birth, ID band Patient awake    Reviewed: Allergy & Precautions, NPO status , Patient's Chart, lab work & pertinent test results  Airway Mallampati: II       Dental  (+) Teeth Intact   Pulmonary Current Smoker, former smoker,    breath sounds clear to auscultation       Cardiovascular hypertension (gestational),  Rhythm:Regular Rate:Normal     Neuro/Psych  Headaches, PSYCHIATRIC DISORDERS Anxiety Depression    GI/Hepatic negative GI ROS, Neg liver ROS,   Endo/Other  diabetes (gestational)  Renal/GU negative Renal ROS     Musculoskeletal   Abdominal   Peds  Hematology   Anesthesia Other Findings   Reproductive/Obstetrics                             Anesthesia Physical Anesthesia Plan  ASA: II  Anesthesia Plan: General   Post-op Pain Management:    Induction: Intravenous  Airway Management Planned: Oral ETT  Additional Equipment:   Intra-op Plan:   Post-operative Plan: Extubation in OR  Informed Consent: I have reviewed the patients History and Physical, chart, labs and discussed the procedure including the risks, benefits and alternatives for the proposed anesthesia with the patient or authorized representative who has indicated his/her understanding and acceptance.     Plan Discussed with:   Anesthesia Plan Comments:         Anesthesia Quick Evaluation

## 2015-06-29 NOTE — Brief Op Note (Signed)
06/29/2015  9:05 AM  PATIENT:  Kim Carrillo  34 y.o. female  PRE-OPERATIVE DIAGNOSIS:  permanent sterilization, bilateral salpingectomy  POST-OPERATIVE DIAGNOSIS:  permanent sterilization, bilateral salpingectomy  PROCEDURE:  Procedure(s): LAPAROSCOPIC BILATERAL SALPINGECTOMY (Bilateral)  SURGEON:  Surgeon(s) and Role:    * Jacquelyne Quarry V Jerrad Mendibles, MD - Primary  PHYSICIAN ASSISTANT:   ASSISTANTS: none   ANESTHESIA:   general  EBL:  Total I/O In: 1000 [I.V.:1000] Out: 5 [Blood:5]  BLOOD ADMINISTERED:none  DRAINS: none   LOCAL MEDICATIONS USED:  NONE  SPECIMEN:  Source of Specimen:  Bilateral fallopian tubes  DISPOSITION OF SPECIMEN:  PATHOLOGY  COUNTS:  YES  TOURNIQUET:  * No tourniquets in log *  DICTATION: .Dragon Dictation  PLAN OF CARE: Discharge to home after PACU  PATIENT DISPOSITION:  PACU - hemodynamically stable.   Delay start of Pharmacological VTE agent (>24hrs) due to surgical blood loss or risk of bleeding: not applicable Details of procedure: Patient was taken operating room prepped and draped for combined abdominal and vaginal procedure, timeout conducted and anesthesia introduced. In and out catheterization of bladder was performed by me as well as placement of narrow Hulka uterine manipulator on the cervix. With attention to the abdomen, infraumbilical 1 cm vertical incision was made as well as a transverse suprapubic and right lower quadrant incision of similar length, with the abdomen, identification of sacral promontory, introduction of the Veress needle carefully orienting it toward the pelvis, with water droplet technique confirming intraperitoneal free flow. The pneumoperitoneum was achieved under 5 mmHg pressure to 3 L CO2 followed by an introduction of the laparoscopic 5 mm trocar placed through the umbilicus using direct visualization with the camera. The suprapubic and right lower quadrant trochars were placed after laparoscopic visualization the  abdomen and normal anatomy confirmed and draped using her attention to the pelvis. The fallopian tubes could be identified on each side to the fimbriated end and ovaries appeared normal. Attention was then directed to the left fallopian tube which was removed by using harmonic scalpel to transected at the tube insertion into the uterus and then mesosalpinx was transected and coagulated with Harmonic scalpel. Specimen was extracted. The right fallopian tube was treated similarly. Pedicles were hemostatic. Ovaries were normal. Shin then was allowed to have deflation the abdomen, instilling 120 cc of saline in the abdomen to assist with evacuation of carbon dioxide then removal of the laparoscopy sleeves, subcuticular 4-0 Vicryl closure the skin incisions and procedure considered complete with placement of Steri-Strips and Band-Aids. Sponge and needle counts correct 

## 2015-06-29 NOTE — Progress Notes (Signed)
IV removed without difficulty from left arm. Catheter intact. Therapy completed.

## 2015-06-29 NOTE — Anesthesia Procedure Notes (Signed)
Procedure Name: Intubation Date/Time: 06/29/2015 7:46 AM Performed by: Despina HiddenIDACAVAGE, Omarrion Carmer J Pre-anesthesia Checklist: Emergency Drugs available, Patient identified, Patient being monitored and Suction available Patient Re-evaluated:Patient Re-evaluated prior to inductionOxygen Delivery Method: Circle system utilized Preoxygenation: Pre-oxygenation with 100% oxygen Intubation Type: IV induction Ventilation: Mask ventilation without difficulty and Oral airway inserted - appropriate to patient size Laryngoscope Size: Mac and 3 Grade View: Grade II Tube type: Oral Tube size: 7.0 mm Number of attempts: 1 Airway Equipment and Method: Stylet and Oral airway Placement Confirmation: ETT inserted through vocal cords under direct vision,  positive ETCO2 and breath sounds checked- equal and bilateral Secured at: 22 cm Tube secured with: Tape Dental Injury: Teeth and Oropharynx as per pre-operative assessment

## 2015-06-29 NOTE — Transfer of Care (Signed)
Immediate Anesthesia Transfer of Care Note  Patient: Kim Carrillo  Procedure(s) Performed: Procedure(s): LAPAROSCOPIC BILATERAL SALPINGECTOMY (Bilateral)  Patient Location: PACU  Anesthesia Type:General  Level of Consciousness: awake and patient cooperative  Airway & Oxygen Therapy: Patient Spontanous Breathing and Patient connected to face mask oxygen  Post-op Assessment: Report given to RN, Post -op Vital signs reviewed and stable and Patient moving all extremities  Post vital signs: Reviewed and stable  Last Vitals:  Filed Vitals:   06/29/15 0725 06/29/15 0730  BP: 122/86 131/91  Temp:    Resp: 13 12    Last Pain: There were no vitals filed for this visit.    Patients Stated Pain Goal: 4 (06/29/15 [id number])  Complications: No apparent anesthesia complications

## 2015-06-29 NOTE — Op Note (Signed)
06/29/2015  9:05 AM  PATIENT:  Selina CooleyVera S Rafanan  35 y.o. female  PRE-OPERATIVE DIAGNOSIS:  permanent sterilization, bilateral salpingectomy  POST-OPERATIVE DIAGNOSIS:  permanent sterilization, bilateral salpingectomy  PROCEDURE:  Procedure(s): LAPAROSCOPIC BILATERAL SALPINGECTOMY (Bilateral)  SURGEON:  Surgeon(s) and Role:    * Tilda BurrowJohn V Kalup Jaquith, MD - Primary  PHYSICIAN ASSISTANT:   ASSISTANTS: none   ANESTHESIA:   general  EBL:  Total I/O In: 1000 [I.V.:1000] Out: 5 [Blood:5]  BLOOD ADMINISTERED:none  DRAINS: none   LOCAL MEDICATIONS USED:  NONE  SPECIMEN:  Source of Specimen:  Bilateral fallopian tubes  DISPOSITION OF SPECIMEN:  PATHOLOGY  COUNTS:  YES  TOURNIQUET:  * No tourniquets in log *  DICTATION: .Dragon Dictation  PLAN OF CARE: Discharge to home after PACU  PATIENT DISPOSITION:  PACU - hemodynamically stable.   Delay start of Pharmacological VTE agent (>24hrs) due to surgical blood loss or risk of bleeding: not applicable Details of procedure: Patient was taken operating room prepped and draped for combined abdominal and vaginal procedure, timeout conducted and anesthesia introduced. In and out catheterization of bladder was performed by me as well as placement of narrow Hulka uterine manipulator on the cervix. With attention to the abdomen, infraumbilical 1 cm vertical incision was made as well as a transverse suprapubic and right lower quadrant incision of similar length, with the abdomen, identification of sacral promontory, introduction of the Veress needle carefully orienting it toward the pelvis, with water droplet technique confirming intraperitoneal free flow. The pneumoperitoneum was achieved under 5 mmHg pressure to 3 L CO2 followed by an introduction of the laparoscopic 5 mm trocar placed through the umbilicus using direct visualization with the camera. The suprapubic and right lower quadrant trochars were placed after laparoscopic visualization the  abdomen and normal anatomy confirmed and draped using her attention to the pelvis. The fallopian tubes could be identified on each side to the fimbriated end and ovaries appeared normal. Attention was then directed to the left fallopian tube which was removed by using harmonic scalpel to transected at the tube insertion into the uterus and then mesosalpinx was transected and coagulated with Harmonic scalpel. Specimen was extracted. The right fallopian tube was treated similarly. Pedicles were hemostatic. Ovaries were normal. Shin then was allowed to have deflation the abdomen, instilling 120 cc of saline in the abdomen to assist with evacuation of carbon dioxide then removal of the laparoscopy sleeves, subcuticular 4-0 Vicryl closure the skin incisions and procedure considered complete with placement of Steri-Strips and Band-Aids. Sponge and needle counts correct

## 2015-06-29 NOTE — Anesthesia Postprocedure Evaluation (Signed)
Anesthesia Post Note  Patient: Kim Carrillo  Procedure(s) Performed: Procedure(s) (LRB): LAPAROSCOPIC BILATERAL SALPINGECTOMY (Bilateral)  Patient location during evaluation: PACU Anesthesia Type: General Level of consciousness: awake and alert, oriented and patient cooperative Pain management: pain level controlled Vital Signs Assessment: post-procedure vital signs reviewed and stable Respiratory status: spontaneous breathing, nonlabored ventilation and respiratory function stable Cardiovascular status: blood pressure returned to baseline Postop Assessment: no signs of nausea or vomiting Anesthetic complications: no    Last Vitals:  Filed Vitals:   06/29/15 0725 06/29/15 0730  BP: 122/86 131/91  Temp:    Resp: 13 12    Last Pain: There were no vitals filed for this visit.               Ashawnti Tangen J

## 2015-06-29 NOTE — Interval H&P Note (Signed)
History and Physical Interval Note:  06/29/2015 7:24 AM  Kim CooleyVera S Omlor  has presented today for surgery, with the diagnosis of sterilization  The various methods of treatment have been discussed with the patient and family. After consideration of risks, benefits and other options for treatment, the patient has consented to  Procedure(s): LAPAROSCOPIC BILATERAL SALPINGECTOMY (Bilateral) as a surgical intervention . The patient understands the irreversibility of the procedure.  The patient's history has been reviewed, patient examined, no change in status, stable for surgery.  I have reviewed the patient's chart and labs.  Questions were answered to the patient's satisfaction.     Tilda BurrowFERGUSON,Jasmene Goswami V

## 2015-07-02 ENCOUNTER — Telehealth: Payer: Self-pay | Admitting: *Deleted

## 2015-07-02 ENCOUNTER — Encounter (HOSPITAL_COMMUNITY): Payer: Self-pay | Admitting: Obstetrics and Gynecology

## 2015-07-02 NOTE — Telephone Encounter (Signed)
Pt states she had surgery on Friday and is still having some bleeding, states it is like a light period.  Bleeding is not heavy and is not passing clots and denies cramping or fever.  Advised pt it can be normal to have some bleeding, keep an eye on it and if it gets worse, starts passing large clots, has sever cramping or develops fever call us back.  Pt verbalized understanding.

## 2015-07-11 ENCOUNTER — Encounter: Payer: Medicaid Other | Admitting: Obstetrics and Gynecology

## 2015-07-16 ENCOUNTER — Ambulatory Visit (INDEPENDENT_AMBULATORY_CARE_PROVIDER_SITE_OTHER): Payer: Medicaid Other | Admitting: Obstetrics and Gynecology

## 2015-07-16 VITALS — BP 118/68 | Ht 61.0 in | Wt 136.0 lb

## 2015-07-16 DIAGNOSIS — Z09 Encounter for follow-up examination after completed treatment for conditions other than malignant neoplasm: Secondary | ICD-10-CM

## 2015-07-16 DIAGNOSIS — Z9889 Other specified postprocedural states: Secondary | ICD-10-CM

## 2015-07-16 NOTE — Progress Notes (Signed)
Patient ID: Kim Carrillo, female   DOB: 06-Jun-1980, 35 y.o.   MRN: [id number] Pt here today for post op. Pt denies any problems or concerns at this time.

## 2015-07-16 NOTE — Progress Notes (Signed)
Patient ID: Kim Carrillo, female   DOB: 07/28/1980, 35 y.o.   MRN: [id number]    Subjective:  Kim Carrillo is a 35 y.o. female now 2 weeks status post bilateral salpingectomy.    no complaints Review of Systems Negative except had shoulder pain postop day 1-2   Diet:   reg   Bowel movements : normal.  The patient is not having any pain.  Objective:  BP 118/68 mmHg  Ht 5\' 1"  (1.549 m)  Wt 136 lb (61.689 kg)  BMI 25.71 kg/m2  Breastfeeding? Yes General:Well developed, well nourished.  No acute distress. Abdomen: Bowel sounds normal, soft, non-tender. Pelvic Exam:    Not done Incision(s):   Healing well, no drainage, no erythema, no hernia, no swelling, no dehiscence,     Assessment:  Post-Op 2 weeks s/p bilateral salpingectomy     Doing well postoperatively.   Plan:  1.Wound care discussed   2. . current medications.none 3. Activity restrictions: none 4. return to work: now. 5. Follow up in 1 yr.

## 2015-07-23 ENCOUNTER — Encounter: Payer: Self-pay | Admitting: Internal Medicine

## 2015-11-28 ENCOUNTER — Encounter (HOSPITAL_COMMUNITY): Payer: Self-pay

## 2016-12-03 ENCOUNTER — Inpatient Hospital Stay (HOSPITAL_COMMUNITY): Payer: Medicaid Other

## 2016-12-03 ENCOUNTER — Other Ambulatory Visit: Payer: Self-pay

## 2016-12-03 ENCOUNTER — Encounter (HOSPITAL_COMMUNITY): Payer: Self-pay

## 2016-12-03 ENCOUNTER — Inpatient Hospital Stay (HOSPITAL_COMMUNITY)
Admission: AD | Admit: 2016-12-03 | Discharge: 2016-12-03 | Disposition: A | Discharge status: Home / Self Care | Payer: Medicaid Other | Source: Ambulatory Visit | Attending: Obstetrics & Gynecology | Admitting: Obstetrics & Gynecology

## 2016-12-03 DIAGNOSIS — F17210 Nicotine dependence, cigarettes, uncomplicated: Secondary | ICD-10-CM | POA: Diagnosis not present

## 2016-12-03 DIAGNOSIS — R102 Pelvic and perineal pain: Secondary | ICD-10-CM | POA: Diagnosis not present

## 2016-12-03 DIAGNOSIS — Z8249 Family history of ischemic heart disease and other diseases of the circulatory system: Secondary | ICD-10-CM | POA: Diagnosis not present

## 2016-12-03 DIAGNOSIS — N83201 Unspecified ovarian cyst, right side: Secondary | ICD-10-CM | POA: Diagnosis not present

## 2016-12-03 DIAGNOSIS — Z8632 Personal history of gestational diabetes: Secondary | ICD-10-CM | POA: Insufficient documentation

## 2016-12-03 LAB — URINALYSIS, ROUTINE W REFLEX MICROSCOPIC
BILIRUBIN URINE: NEGATIVE
GLUCOSE, UA: NEGATIVE mg/dL
HGB URINE DIPSTICK: NEGATIVE
Ketones, ur: NEGATIVE mg/dL
Nitrite: NEGATIVE
PH: 7 (ref 5.0–8.0)
Protein, ur: NEGATIVE mg/dL
Specific Gravity, Urine: 1.008 (ref 1.005–1.030)

## 2016-12-03 LAB — CBC
HEMATOCRIT: 39.5 % (ref 36.0–46.0)
Hemoglobin: 12.9 g/dL (ref 12.0–15.0)
MCH: 31.9 pg (ref 26.0–34.0)
MCHC: 32.7 g/dL (ref 30.0–36.0)
MCV: 97.5 fL (ref 78.0–100.0)
PLATELETS: 280 10*3/uL (ref 150–400)
RBC: 4.05 MIL/uL (ref 3.87–5.11)
RDW: 12.8 % (ref 11.5–15.5)
WBC: 6.8 10*3/uL (ref 4.0–10.5)

## 2016-12-03 LAB — POCT PREGNANCY, URINE: PREG TEST UR: NEGATIVE

## 2016-12-03 LAB — WET PREP, GENITAL
Clue Cells Wet Prep HPF POC: NONE SEEN
Trich, Wet Prep: NONE SEEN
Yeast Wet Prep HPF POC: NONE SEEN

## 2016-12-03 MED ORDER — IBUPROFEN 800 MG PO TABS: 800.0000 mg | ORAL_TABLET | Freq: Three times a day (TID) | ORAL | 0 refills | Status: AC | PRN

## 2016-12-03 MED ORDER — KETOROLAC TROMETHAMINE 30 MG/ML IJ SOLN
60.0000 mg | Freq: Once | INTRAMUSCULAR | Status: AC
  Administered 2016-12-03: 60 mg via INTRAMUSCULAR
  Filled 2016-12-03: qty 2

## 2016-12-03 NOTE — Discharge Instructions (Signed)
Ovarian Cyst °An ovarian cyst is a fluid-filled sac on an ovary. The ovaries are organs that make eggs in women. Most ovarian cysts go away on their own and are not cancerous (are benign). Some cysts need treatment. °Follow these instructions at home: °· Take over-the-counter and prescription medicines only as told by your doctor. °· Do not drive or use heavy machinery while taking prescription pain medicine. °· Get pelvic exams and Pap tests as often as told by your doctor. °· Return to your normal activities as told by your doctor. Ask your doctor what activities are safe for you. °· Do not use any products that contain nicotine or tobacco, such as cigarettes and e-cigarettes. If you need help quitting, ask your doctor. °· Keep all follow-up visits as told by your doctor. This is important. °Contact a doctor if: °· Your periods are: °¨ Late. °¨ Irregular. °¨ Painful. °· Your periods stop. °· You have pelvic pain that does not go away. °· You have pressure on your bladder. °· You have trouble making your bladder empty when you pee (urinate). °· You have pain during sex. °· You have any of the following in your belly (abdomen): °¨ A feeling of fullness. °¨ Pressure. °¨ Discomfort. °¨ Pain that does not go away. °¨ Swelling. °· You feel sick most of the time. °· You have trouble pooping (have constipation). °· You are not as hungry as usual (you lose your appetite). °· You get very bad acne. °· You start to have more hair on your body and face. °· You are gaining weight or losing weight without changing your exercise and eating habits. °· You think you may be pregnant. °Get help right away if: °· You have belly pain that is very bad or gets worse. °· You cannot eat or drink without throwing up (vomiting). °· You suddenly get a fever. °· Your period is a lot heavier than usual. °This information is not intended to replace advice given to you by your health care provider. Make sure you discuss any questions you have  with your health care provider. °Document Released: 06/18/2007 Document Revised: 07/20/2015 Document Reviewed: 06/03/2015 °Elsevier Interactive Patient Education © 2017 [employer] ° °

## 2016-12-03 NOTE — MAU Provider Note (Signed)
History     CSN: [id number]  Arrival date and time: 12/03/16 4098   First Provider Initiated Contact with Patient 12/03/16 1805      Chief Complaint  Patient presents with  . Abdominal Pain   Non-pregnant female here with pelvic pain. Pain started last night after IC. She reports feeling bloated and full in the lower abdomen. Denies Vb or discharge. No fevers. No urinary sx. No new partner. She has not taken anything for the pain. Pain is worse on RLQ. Rates 3/10. She reports similar sx about 10 years ago when she had a ruptured hemorrhagic cyst after having IC and then required surgery.    Past Medical History:  Diagnosis Date  . ANAL FISSURE, HX OF 11/23/2006   Qualifier: Diagnosis of  By: Genelle Gather CMA, [place]    . Anxiety   . ANXIETY DEPRESSION 11/23/2006   Qualifier: Diagnosis of  By: Genelle Gather CMA, [place]    . Gestational diabetes    previous pregnancy  . Migraine headache   . MIGRAINE HEADACHE 11/23/2006   Qualifier: Diagnosis of  By: Genelle Gather CMA, [place]      Past Surgical History:  Procedure Laterality Date  . DILATION AND CURETTAGE OF UTERUS  2008   TAB X 1  . LAPAROSCOPIC BILATERAL SALPINGECTOMY Bilateral 06/29/2015   Procedure: LAPAROSCOPIC BILATERAL SALPINGECTOMY;  Surgeon: Tilda Burrow, MD;  Location: [facility];  Service: Gynecology;  Laterality: Bilateral;  . OVARIAN CYST SURGERY      Family History  Problem Relation Age of Onset  . Hypertension Mother   . Hypertension Maternal Grandfather   . Heart attack Paternal Grandmother   . Gallstones Paternal Grandfather     Social History   Tobacco Use  . Smoking status: Current Every Day Smoker    Packs/day: 0.50    Years: 2.00    Pack years: 1.00    Types: Cigarettes  . Smokeless tobacco: Never Used  Substance Use Topics  . Alcohol use: Yes    Comment: occ  . Drug use: No    Allergies: No Known Allergies  Medications Prior to Admission  Medication Sig Dispense Refill Last Dose  . acetaminophen  (TYLENOL) 325 MG tablet Take 650 mg by mouth every 6 (six) hours as needed for headache.    Taking  . ibuprofen (ADVIL,MOTRIN) 200 MG tablet Take 200 mg by mouth every 6 (six) hours as needed.   Taking  . Pediatric Multiple Vit-C-FA (FLINSTONES GUMMIES OMEGA-3 DHA PO) Take 3 each by mouth daily.    Taking    Review of Systems  Constitutional: Positive for chills. Negative for fever.  Gastrointestinal: Negative for constipation, diarrhea, nausea and vomiting.  Genitourinary: Positive for pelvic pain. Negative for vaginal bleeding and vaginal discharge.   Physical Exam   Blood pressure 119/76, pulse 78, temperature 98.7 F (37.1 C), temperature source Oral, resp. rate 18, currently breastfeeding.  Physical Exam  Nursing note and vitals reviewed. Constitutional: She is oriented to person, place, and time. She appears well-developed and well-nourished. No distress.  HENT:  Head: Normocephalic and atraumatic.  Neck: Normal range of motion.  Cardiovascular: Normal rate.  Respiratory: Effort normal. No respiratory distress.  GI: Soft. She exhibits no distension and no mass. There is tenderness (RLQ). There is no rebound and no guarding.  Genitourinary:  Genitourinary Comments: External: no lesions or erythema Vagina: rugated, pink, moist, small thin clear discharge Uterus: non enlarged, anteverted, non tender, no CMT Adnexae: no masses, no tenderness left, + tenderness right  Musculoskeletal: Normal range of motion.  Neurological: She is alert and oriented to person, place, and time.  Skin: Skin is warm and dry.  Psychiatric: She has a normal mood and affect.   Results for orders placed or performed during the hospital encounter of 12/03/16 (from the past 24 hour(s))  Urinalysis, Routine w reflex microscopic     Status: Abnormal   Collection Time: 12/03/16  5:40 PM  Result Value Ref Range   Color, Urine YELLOW YELLOW   APPearance CLEAR CLEAR   Specific Gravity, Urine 1.008 1.005 -  1.030   pH 7.0 5.0 - 8.0   Glucose, UA NEGATIVE NEGATIVE mg/dL   Hgb urine dipstick NEGATIVE NEGATIVE   Bilirubin Urine NEGATIVE NEGATIVE   Ketones, ur NEGATIVE NEGATIVE mg/dL   Protein, ur NEGATIVE NEGATIVE mg/dL   Nitrite NEGATIVE NEGATIVE   Leukocytes, UA TRACE (A) NEGATIVE   RBC / HPF 0-5 0 - 5 RBC/hpf   WBC, UA 0-5 0 - 5 WBC/hpf   Bacteria, UA RARE (A) NONE SEEN   Squamous Epithelial / LPF 6-30 (A) NONE SEEN   Mucus PRESENT    Sperm, UA PRESENT   Wet prep, genital     Status: Abnormal   Collection Time: 12/03/16  6:07 PM  Result Value Ref Range   Yeast Wet Prep HPF POC NONE SEEN NONE SEEN   Trich, Wet Prep NONE SEEN NONE SEEN   Clue Cells Wet Prep HPF POC NONE SEEN NONE SEEN   WBC, Wet Prep HPF POC MODERATE (A) NONE SEEN   Sperm PRESENT   CBC     Status: None   Collection Time: 12/03/16  6:16 PM  Result Value Ref Range   WBC 6.8 4.0 - 10.5 K/uL   RBC 4.05 3.87 - 5.11 MIL/uL   Hemoglobin 12.9 12.0 - 15.0 g/dL   HCT 16.1 09.6 - 04.5 %   MCV 97.5 78.0 - 100.0 fL   MCH 31.9 26.0 - 34.0 pg   MCHC 32.7 30.0 - 36.0 g/dL   RDW 40.9 81.1 - 91.4 %   Platelets 280 150 - 400 K/uL  Pregnancy, urine POC     Status: None   Collection Time: 12/03/16  6:20 PM  Result Value Ref Range   Preg Test, Ur NEGATIVE NEGATIVE   US Pelvis Transvanginal Non-ob (tv Only)  Result Date: 12/03/2016 CLINICAL DATA:  Left lower quadrant pelvic pain for 1 day. EXAM: TRANSABDOMINAL AND TRANSVAGINAL ULTRASOUND OF PELVIS DOPPLER ULTRASOUND OF OVARIES TECHNIQUE: Both transabdominal and transvaginal ultrasound examinations of the pelvis were performed. Transabdominal technique was performed for global imaging of the pelvis including uterus, ovaries, adnexal regions, and pelvic cul-de-sac. It was necessary to proceed with endovaginal exam following the transabdominal exam to visualize the uterus, ovaries, and adnexa. Color and duplex Doppler ultrasound was utilized to evaluate blood flow to the ovaries.  COMPARISON:  Obstetric ultrasounds including 11/28/2014. FINDINGS: Uterus Measurements: 8.9 x 5.3 x 7.0 cm. No fibroids or other mass visualized. Endometrium Thickness: Normal, 7 mm.  No focal abnormality visualized. Right ovary Measurements: 5.0 x 3.3 x 3.7 cm. A complex cystic lesion within measures 4.4 x 2.9 x 3.4 cm. Lacey heterogeneous echotexture is within. No central vascularity. Left ovary Measurements: 2.5 x 1.7 x 3.1 cm. Normal appearance/no adnexal mass. Pulsed Doppler evaluation of both ovaries demonstrates normal low-resistance arterial and venous waveforms. Other findings Trace free pelvic fluid is likely physiologic. IMPRESSION: 1. No ovarian or adnexal torsion. 2. Right ovarian lesion is most consistent  with a hemorrhagic cyst. Per consensus criteria, this does not require imaging follow-up. This recommendation follows the consensus statement: Management of Asymptomatic Ovarian and Other Adnexal Cysts Imaged at US: Society of Radiologists in Ultrasound Consensus Conference Statement. Radiology 2010; [phone]. Electronically Signed   By: Jeronimo GreavesKyle  Talbot M.D.   On: 12/03/2016 19:29   [place] Pelvis (transabdominal Only)  Result Date: 12/03/2016 CLINICAL DATA:  Left lower quadrant pelvic pain for 1 day. EXAM: TRANSABDOMINAL AND TRANSVAGINAL ULTRASOUND OF PELVIS DOPPLER ULTRASOUND OF OVARIES TECHNIQUE: Both transabdominal and transvaginal ultrasound examinations of the pelvis were performed. Transabdominal technique was performed for global imaging of the pelvis including uterus, ovaries, adnexal regions, and pelvic cul-de-sac. It was necessary to proceed with endovaginal exam following the transabdominal exam to visualize the uterus, ovaries, and adnexa. Color and duplex Doppler ultrasound was utilized to evaluate blood flow to the ovaries. COMPARISON:  Obstetric ultrasounds including 11/28/2014. FINDINGS: Uterus Measurements: 8.9 x 5.3 x 7.0 cm. No fibroids or other mass visualized. Endometrium  Thickness: Normal, 7 mm.  No focal abnormality visualized. Right ovary Measurements: 5.0 x 3.3 x 3.7 cm. A complex cystic lesion within measures 4.4 x 2.9 x 3.4 cm. Lacey heterogeneous echotexture is within. No central vascularity. Left ovary Measurements: 2.5 x 1.7 x 3.1 cm. Normal appearance/no adnexal mass. Pulsed Doppler evaluation of both ovaries demonstrates normal low-resistance arterial and venous waveforms. Other findings Trace free pelvic fluid is likely physiologic. IMPRESSION: 1. No ovarian or adnexal torsion. 2. Right ovarian lesion is most consistent with a hemorrhagic cyst. Per consensus criteria, this does not require imaging follow-up. This recommendation follows the consensus statement: Management of Asymptomatic Ovarian and Other Adnexal Cysts Imaged at US: Society of Radiologists in Ultrasound Consensus Conference Statement. Radiology 2010; [phone]. Electronically Signed   By: Jeronimo GreavesKyle  Talbot M.D.   On: 12/03/2016 19:29   [place] Pelvic Doppler (torsion R/o Or Mass Arterial Flow)  Result Date: 12/03/2016 CLINICAL DATA:  Left lower quadrant pelvic pain for 1 day. EXAM: TRANSABDOMINAL AND TRANSVAGINAL ULTRASOUND OF PELVIS DOPPLER ULTRASOUND OF OVARIES TECHNIQUE: Both transabdominal and transvaginal ultrasound examinations of the pelvis were performed. Transabdominal technique was performed for global imaging of the pelvis including uterus, ovaries, adnexal regions, and pelvic cul-de-sac. It was necessary to proceed with endovaginal exam following the transabdominal exam to visualize the uterus, ovaries, and adnexa. Color and duplex Doppler ultrasound was utilized to evaluate blood flow to the ovaries. COMPARISON:  Obstetric ultrasounds including 11/28/2014. FINDINGS: Uterus Measurements: 8.9 x 5.3 x 7.0 cm. No fibroids or other mass visualized. Endometrium Thickness: Normal, 7 mm.  No focal abnormality visualized. Right ovary Measurements: 5.0 x 3.3 x 3.7 cm. A complex cystic lesion within  measures 4.4 x 2.9 x 3.4 cm. Lacey heterogeneous echotexture is within. No central vascularity. Left ovary Measurements: 2.5 x 1.7 x 3.1 cm. Normal appearance/no adnexal mass. Pulsed Doppler evaluation of both ovaries demonstrates normal low-resistance arterial and venous waveforms. Other findings Trace free pelvic fluid is likely physiologic. IMPRESSION: 1. No ovarian or adnexal torsion. 2. Right ovarian lesion is most consistent with a hemorrhagic cyst. Per consensus criteria, this does not require imaging follow-up. This recommendation follows the consensus statement: Management of Asymptomatic Ovarian and Other Adnexal Cysts Imaged at US: Society of Radiologists in Ultrasound Consensus Conference Statement. Radiology 2010; [phone]. Electronically Signed   By: Jeronimo GreavesKyle  Talbot M.D.   On: 12/03/2016 19:29   MAU Course  Procedures Toradol  MDM Labs and US ordered and reviewed. No evidence of acute abdominal  or pelvic process. Pain somewhat improved, mostly having pressure. Rt hemorrhagic cyst noted on US, likely cause of pain, no intervention indicated. Will manage pain with Ibuprofen. Stable for discharge home  Assessment and Plan   1. Hemorrhagic cyst of right ovary   2. Pelvic pain    [place] Return to [facility]U for OBGYN emergencies  Allergies as of 12/03/2016   No Known Allergies     Medication List    TAKE these medications   acetaminophen 325 MG tablet Commonly known as:  TYLENOL Take 650 mg by mouth every 6 (six) hours as needed for headache.   FLINSTONES GUMMIES OMEGA-3 DHA PO Take 3 each by mouth daily.   ibuprofen 800 MG tablet Commonly known as:  ADVIL,MOTRIN Take 1 tablet (800 mg total) by mouth every 8 (eight) hours as needed for moderate pain. What changed:    medication strength  how much to take  when to take this  reasons to take this      Donette LarryMelanie Josue Falconi, CNM 12/03/2016, 7:52 PM

## 2016-12-03 NOTE — MAU Note (Signed)
Pt presents to MAU with c/o of lower abdominal pain that started last night after having intercourse. Pt denies vaginal bleeding and discharge.

## 2016-12-05 LAB — GC/CHLAMYDIA PROBE AMP (~~LOC~~) NOT AT ARMC
Chlamydia: NEGATIVE
NEISSERIA GONORRHEA: NEGATIVE

## 2019-04-02 IMAGING — US US TRANSVAGINAL NON-OB
1 series · 15 of 25 positions shown · non-contrast
Comparison: Obstetric ultrasounds including 11/28/2014.

CLINICAL DATA: Left lower quadrant pelvic pain for 1 day.

EXAM:
TRANSABDOMINAL AND TRANSVAGINAL ULTRASOUND OF PELVIS
DOPPLER ULTRASOUND OF OVARIES
TECHNIQUE: Both transabdominal and transvaginal ultrasound examinations of the
pelvis were performed. Transabdominal technique was performed for
global imaging of the pelvis including uterus, ovaries, adnexal
regions, and pelvic cul-de-sac.
It was necessary to proceed with endovaginal exam following the
transabdominal exam to visualize the uterus, ovaries, and adnexa.
Color and duplex Doppler ultrasound was utilized to evaluate blood
flow to the ovaries.

[Series 1: us transvaginal non-ob · 15 of 91 slices shown]
[im 1/91]
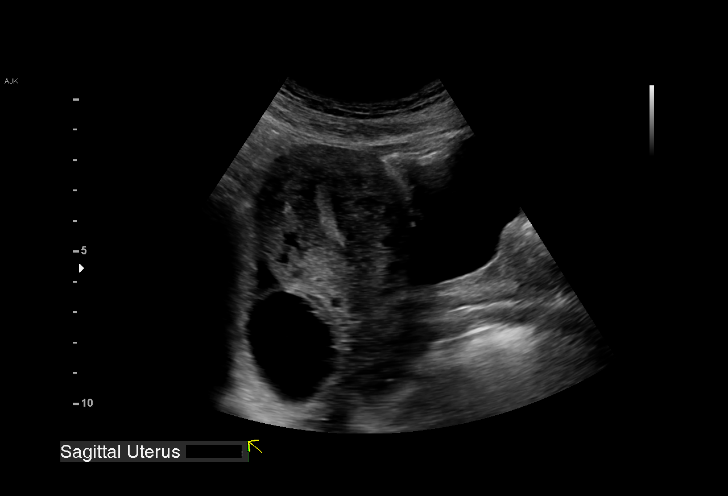
[im 8/91]
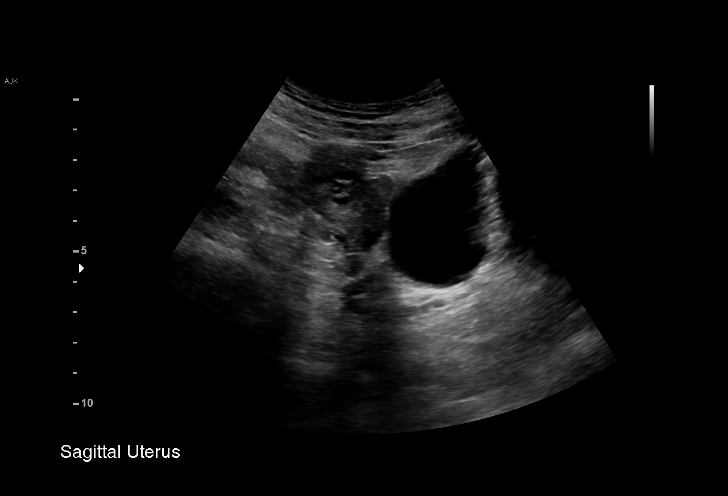
[im 16/91]
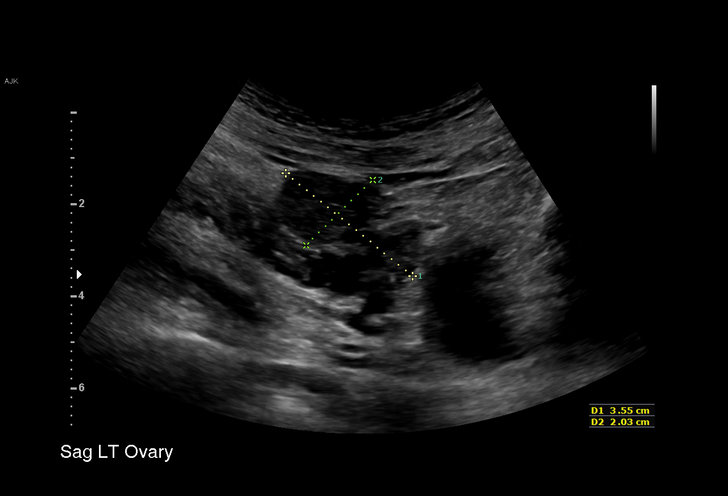
[im 19/91]
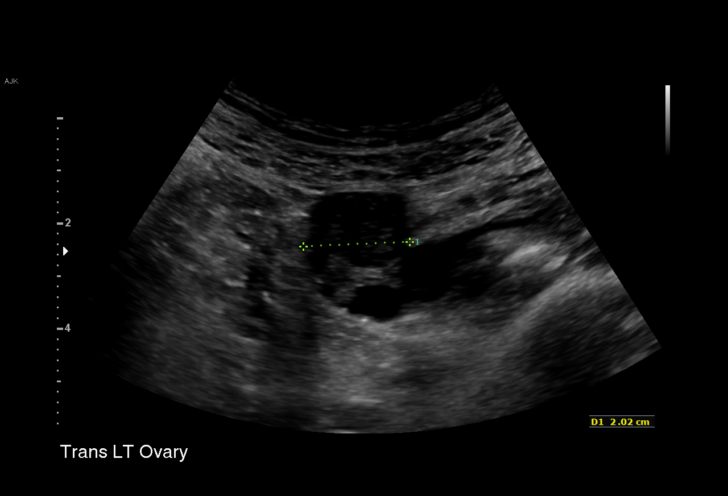
[im 27/91]
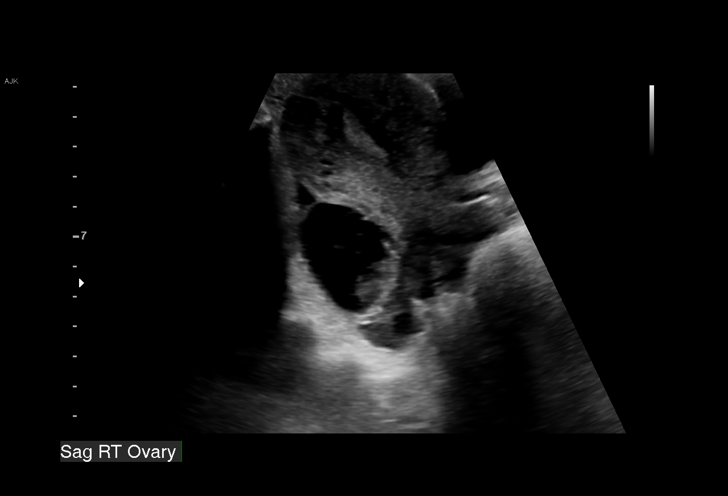
[im 34/91]
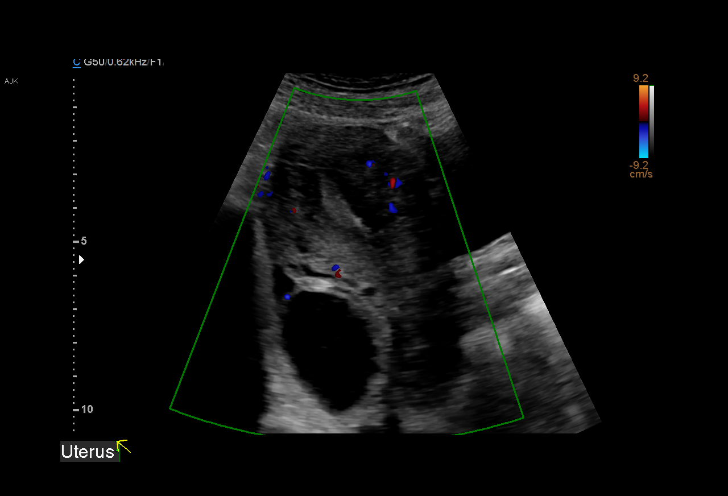
[im 38/91]
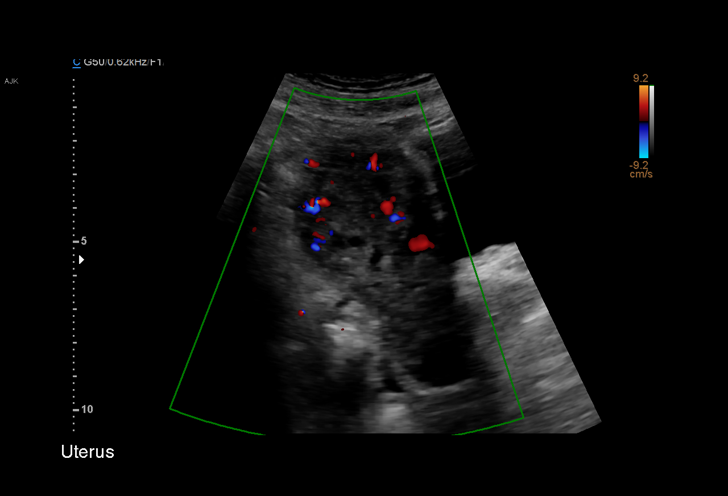
[im 46/91]
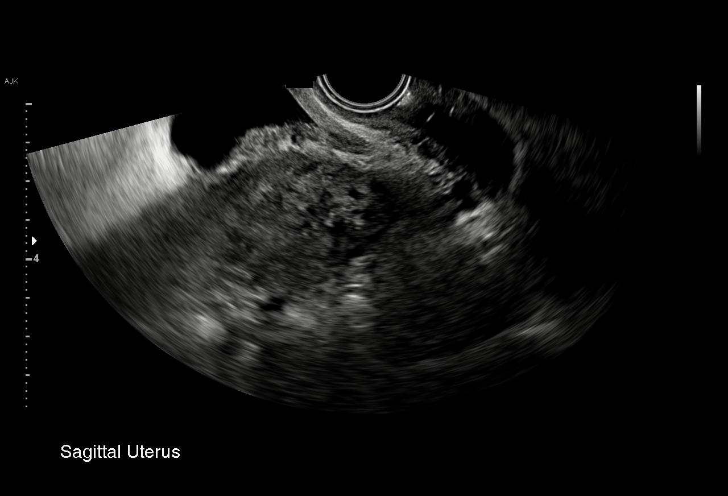
[im 53/91]
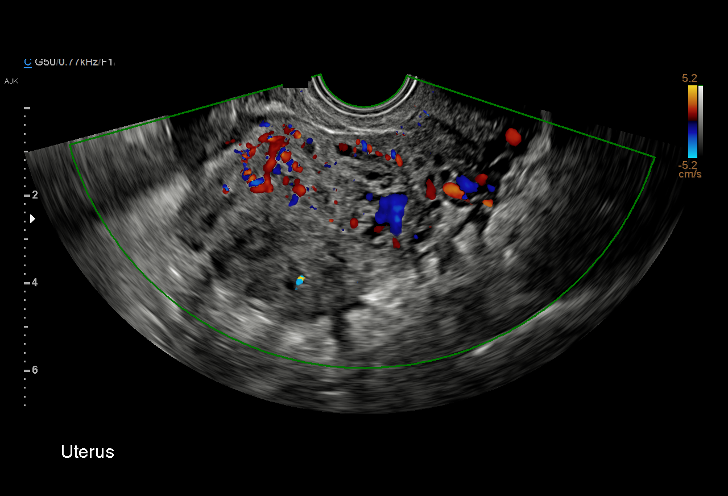
[im 57/91]
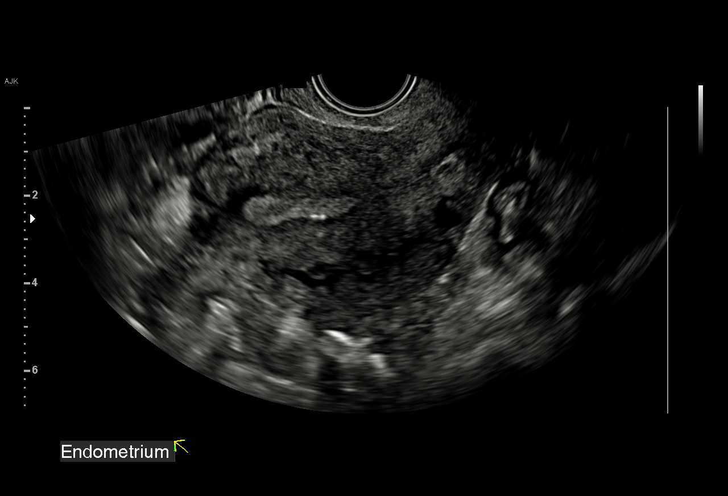
[im 64/91]
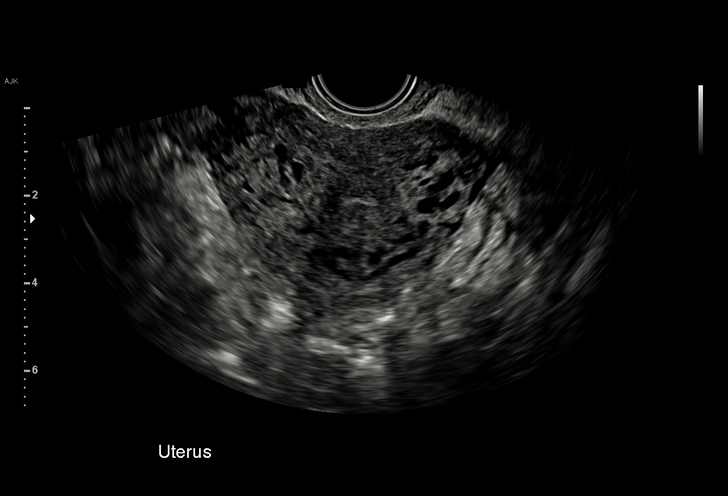
[im 72/91]
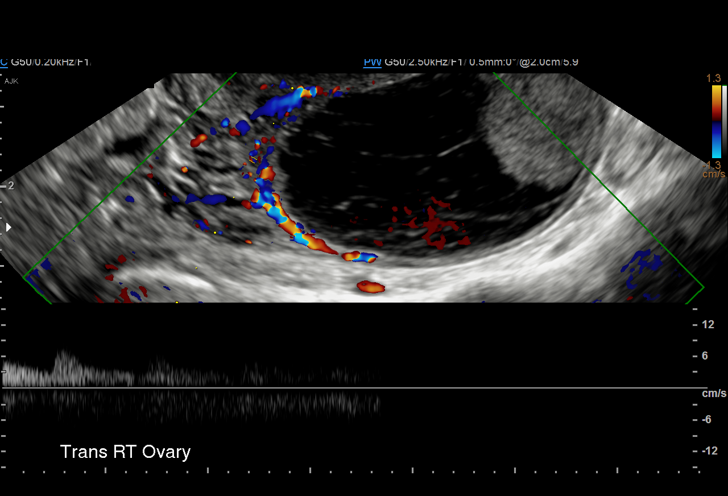
[im 76/91]
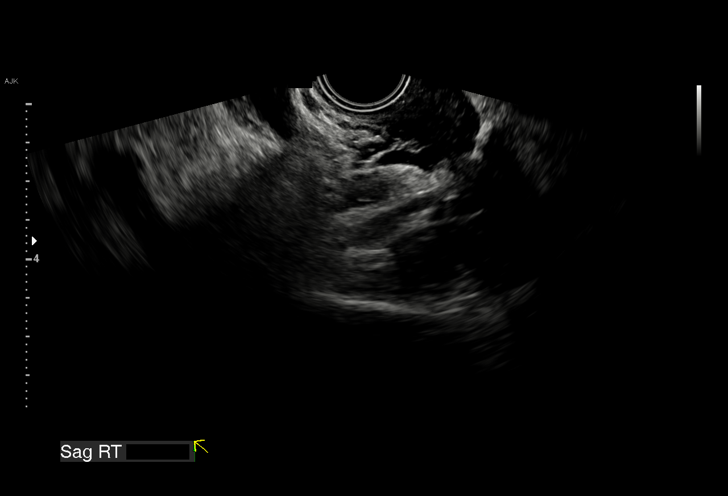
[im 83/91]
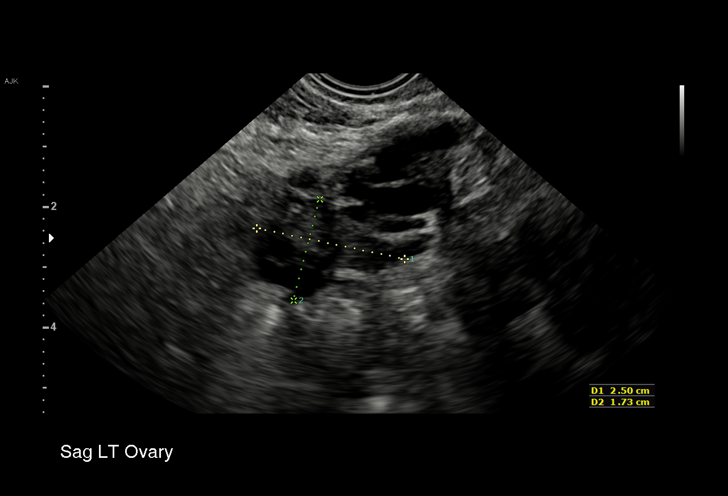
[im 91/91]
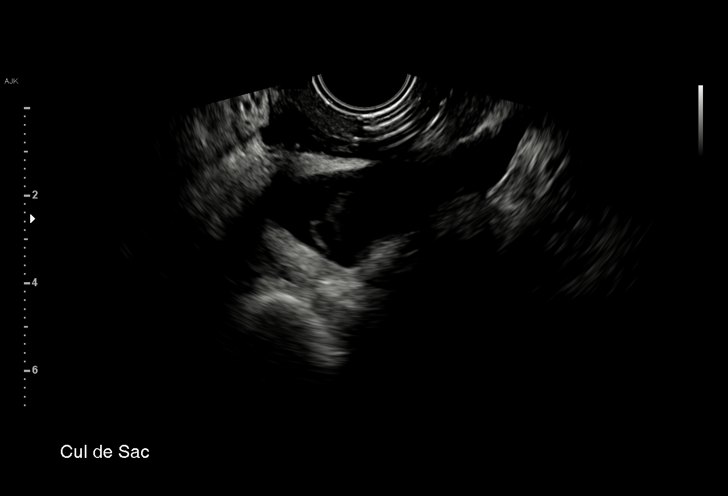

[15 of 25 positions shown; findings below may reference images not displayed]

FINDINGS: Uterus

Measurements: 8.9 x 5.3 x 7.0 cm. No fibroids or other mass
visualized.

Endometrium

Thickness: Normal, 7 mm..  No focal abnormality visualized.

Right ovary

Measurements: 5.0 x 3.3 x 3.7 cm.. A complex cystic lesion within
measures 4.4 x 2.9 x 3.4 cm. Lacey heterogeneous echotexture is
within. No central vascularity.

Left ovary

Measurements: 2.5 x 1.7 x 3.1 cm. Normal appearance/no adnexal mass.

Pulsed Doppler evaluation of both ovaries demonstrates normal
low-resistance arterial and venous waveforms.

Other findings

Trace free pelvic fluid is likely physiologic.
IMPRESSION: 1. No ovarian or adnexal torsion.
2. Right ovarian lesion is most consistent with a hemorrhagic cyst.
Per consensus criteria, this does not require imaging follow-up.
This recommendation follows the consensus statement: Management of
Asymptomatic Ovarian and Other Adnexal Cysts Imaged at US: Society
of Radiologists in Ultrasound Consensus Conference Statement.
